# Patient Record
Sex: Female | Born: 1950 | Race: Black or African American | Hispanic: No | Marital: Married | Smoking: Never smoker
Health system: Southern US, Community
[De-identification: ages and names within clinical notes are randomized; demographics above are authoritative.]

## PROBLEM LIST (undated history)

## (undated) DIAGNOSIS — I2510 Atherosclerotic heart disease of native coronary artery without angina pectoris: Secondary | ICD-10-CM

## (undated) DIAGNOSIS — I209 Angina pectoris, unspecified: Secondary | ICD-10-CM

## (undated) DIAGNOSIS — E119 Type 2 diabetes mellitus without complications: Secondary | ICD-10-CM

## (undated) DIAGNOSIS — E785 Hyperlipidemia, unspecified: Secondary | ICD-10-CM

## (undated) DIAGNOSIS — G4700 Insomnia, unspecified: Secondary | ICD-10-CM

## (undated) DIAGNOSIS — M1990 Unspecified osteoarthritis, unspecified site: Secondary | ICD-10-CM

## (undated) DIAGNOSIS — I10 Essential (primary) hypertension: Secondary | ICD-10-CM

---

## 2003-09-28 HISTORY — PX: CORONARY ANGIOGRAM: SHX5786

## 2015-06-04 ENCOUNTER — Emergency Department (HOSPITAL_COMMUNITY): Payer: Medicaid Other

## 2015-06-04 ENCOUNTER — Emergency Department (HOSPITAL_COMMUNITY)
Admission: EM | Admit: 2015-06-04 | Discharge: 2015-06-04 | Disposition: A | Discharge status: Home / Self Care | Payer: Medicaid Other | Attending: Emergency Medicine | Admitting: Emergency Medicine

## 2015-06-04 ENCOUNTER — Emergency Department (HOSPITAL_BASED_OUTPATIENT_CLINIC_OR_DEPARTMENT_OTHER): Payer: Medicaid Other

## 2015-06-04 ENCOUNTER — Encounter (HOSPITAL_COMMUNITY): Payer: Self-pay | Admitting: *Deleted

## 2015-06-04 DIAGNOSIS — M79661 Pain in right lower leg: Secondary | ICD-10-CM | POA: Insufficient documentation

## 2015-06-04 DIAGNOSIS — M7989 Other specified soft tissue disorders: Secondary | ICD-10-CM

## 2015-06-04 DIAGNOSIS — E119 Type 2 diabetes mellitus without complications: Secondary | ICD-10-CM | POA: Diagnosis not present

## 2015-06-04 DIAGNOSIS — R51 Headache: Secondary | ICD-10-CM | POA: Insufficient documentation

## 2015-06-04 DIAGNOSIS — M542 Cervicalgia: Secondary | ICD-10-CM | POA: Insufficient documentation

## 2015-06-04 DIAGNOSIS — I10 Essential (primary) hypertension: Secondary | ICD-10-CM | POA: Insufficient documentation

## 2015-06-04 HISTORY — DX: Essential (primary) hypertension: I10

## 2015-06-04 HISTORY — DX: Hyperlipidemia, unspecified: E78.5

## 2015-06-04 HISTORY — DX: Type 2 diabetes mellitus without complications: E11.9

## 2015-06-04 LAB — CBC
HCT: 36.7 % (ref 36.0–46.0)
Hemoglobin: 12.2 g/dL (ref 12.0–15.0)
MCH: 29.2 pg (ref 26.0–34.0)
MCHC: 33.2 g/dL (ref 30.0–36.0)
MCV: 87.8 fL (ref 78.0–100.0)
PLATELETS: 211 10*3/uL (ref 150–400)
RBC: 4.18 MIL/uL (ref 3.87–5.11)
RDW: 14.6 % (ref 11.5–15.5)
WBC: 5.5 10*3/uL (ref 4.0–10.5)

## 2015-06-04 LAB — I-STAT TROPONIN, ED: TROPONIN I, POC: 0 ng/mL (ref 0.00–0.08)

## 2015-06-04 LAB — BASIC METABOLIC PANEL
ANION GAP: 7 (ref 5–15)
BUN: 10 mg/dL (ref 6–20)
CALCIUM: 9.4 mg/dL (ref 8.9–10.3)
CO2: 27 mmol/L (ref 22–32)
Chloride: 105 mmol/L (ref 101–111)
Creatinine, Ser: 0.64 mg/dL (ref 0.44–1.00)
GFR calc Af Amer: >60 mL/min (ref >60)
GLUCOSE: 96 mg/dL (ref 65–99)
Potassium: 4 mmol/L (ref 3.5–5.1)
SODIUM: 139 mmol/L (ref 135–145)

## 2015-06-04 MED ORDER — CYCLOBENZAPRINE HCL 10 MG PO TABS: 10.0000 mg | ORAL_TABLET | Freq: Three times a day (TID) | ORAL | Status: DC | PRN

## 2015-06-04 MED ORDER — CYCLOBENZAPRINE HCL 10 MG PO TABS
10.0000 mg | ORAL_TABLET | Freq: Once | ORAL | Status: AC
  Administered 2015-06-04: 10 mg via ORAL
  Filled 2015-06-04: qty 1

## 2015-06-04 MED ORDER — CYCLOBENZAPRINE HCL 10 MG PO TABS: 10.0000 mg | ORAL_TABLET | Freq: Two times a day (BID) | ORAL | Status: DC | PRN

## 2015-06-04 NOTE — Discharge Instructions (Signed)
Cervical Sprain °A cervical sprain is an injury in the neck in which the strong, fibrous tissues (ligaments) that connect your neck bones stretch or tear. Cervical sprains can range from mild to severe. Severe cervical sprains can cause the neck vertebrae to be unstable. This can lead to damage of the spinal cord and can result in serious nervous system problems. The amount of time it takes for a cervical sprain to get better depends on the cause and extent of the injury. Most cervical sprains heal in 1 to 3 weeks. °CAUSES  °Severe cervical sprains may be caused by:  °· Contact sport injuries (such as from football, rugby, wrestling, hockey, auto racing, gymnastics, diving, martial arts, or boxing).   °· Motor vehicle collisions.   °· Whiplash injuries. This is an injury from a sudden forward and backward whipping movement of the head and neck.  °· Falls.   °Mild cervical sprains may be caused by:  °· Being in an awkward position, such as while cradling a telephone between your ear and shoulder.   °· Sitting in a chair that does not offer proper support.   °· Working at a poorly designed computer station.   °· Looking up or down for long periods of time.   °SYMPTOMS  °· Pain, soreness, stiffness, or a burning sensation in the front, back, or sides of the neck. This discomfort may develop immediately after the injury or slowly, 24 hours or more after the injury.   °· Pain or tenderness directly in the middle of the back of the neck.   °· Shoulder or upper back pain.   °· Limited ability to move the neck.   °· Headache.   °· Dizziness.   °· Weakness, numbness, or tingling in the hands or arms.   °· Muscle spasms.   °· Difficulty swallowing or chewing.   °· Tenderness and swelling of the neck.   °DIAGNOSIS  °Most of the time your health care provider can diagnose a cervical sprain by taking your history and doing a physical exam. Your health care provider will ask about previous neck injuries and any known neck  problems, such as arthritis in the neck. X-rays may be taken to find out if there are any other problems, such as with the bones of the neck. Other tests, such as a CT scan or MRI, may also be needed.  °TREATMENT  °Treatment depends on the severity of the cervical sprain. Mild sprains can be treated with rest, keeping the neck in place (immobilization), and pain medicines. Severe cervical sprains are immediately immobilized. Further treatment is done to help with pain, muscle spasms, and other symptoms and may include: °· Medicines, such as pain relievers, numbing medicines, or muscle relaxants.   °· Physical therapy. This may involve stretching exercises, strengthening exercises, and posture training. Exercises and improved posture can help stabilize the neck, strengthen muscles, and help stop symptoms from returning.   °HOME CARE INSTRUCTIONS  °· Put ice on the injured area.   °¨ Put ice in a plastic bag.   °¨ Place a towel between your skin and the bag.   °¨ Leave the ice on for 15-20 minutes, 3-4 times a day.   °· If your injury was severe, you may have been given a cervical collar to wear. A cervical collar is a two-piece collar designed to keep your neck from moving while it heals. °¨ Do not remove the collar unless instructed by your health care provider. °¨ If you have long hair, keep it outside of the collar. °¨ Ask your health care provider before making any adjustments to your collar. Minor   adjustments may be required over time to improve comfort and reduce pressure on your chin or on the back of your head.  Ifyou are allowed to remove the collar for cleaning or bathing, follow your health care provider's instructions on how to do so safely.  Keep your collar clean by wiping it with mild soap and water and drying it completely. If the collar you have been given includes removable pads, remove them every 1-2 days and hand wash them with soap and water. Allow them to air dry. They should be completely  dry before you wear them in the collar.  If you are allowed to remove the collar for cleaning and bathing, wash and dry the skin of your neck. Check your skin for irritation or sores. If you see any, tell your health care provider.  Do not drive while wearing the collar.   Only take over-the-counter or prescription medicines for pain, discomfort, or fever as directed by your health care provider.   Keep all follow-up appointments as directed by your health care provider.   Keep all physical therapy appointments as directed by your health care provider.   Make any needed adjustments to your workstation to promote good posture.   Avoid positions and activities that make your symptoms worse.   Warm up and stretch before being active to help prevent problems.  SEEK MEDICAL CARE IF:   Your pain is not controlled with medicine.   You are unable to decrease your pain medicine over time as planned.   Your activity level is not improving as expected.  SEEK IMMEDIATE MEDICAL CARE IF:   You develop any bleeding.  You develop stomach upset.  You have signs of an allergic reaction to your medicine.   Your symptoms get worse.   You develop new, unexplained symptoms.   You have numbness, tingling, weakness, or paralysis in any part of your body.  MAKE SURE YOU:   Understand these instructions.  Will watch your condition.  Will get help right away if you are not doing well or get worse. Document Released: 07/11/2007 Document Revised: 09/18/2013 Document Reviewed: 03/21/2013 [facility] Patient Information 2015 [place], [place]. This information is not intended to replace advice given to you by your health care provider. Make sure you discuss any questions you have with your health care provider.   Emergency Department Resource Guide 1) Find a Doctor and Pay Out of Pocket Although you won't have to find out who is covered by your insurance plan, it is a good idea to ask  around and get recommendations. You will then need to call the office and see if the doctor you have chosen will accept you as a new patient and what types of options they offer for patients who are self-pay. Some doctors offer discounts or will set up payment plans for their patients who do not have insurance, but you will need to ask so you aren't surprised when you get to your appointment.  2) Contact Your Local Health Department Not all health departments have doctors that can see patients for sick visits, but many do, so it is worth a call to see if yours does. If you don't know where your local health department is, you can check in your phone book. The CDC also has a tool to help you locate your state's health department, and many state websites also have listings of all of their local health departments.  3) Find a [facility] If your illness is not  likely to be very severe or complicated, you may want to try a walk in clinic. These are popping up all over the country in pharmacies, drugstores, and shopping centers. They're usually staffed by nurse practitioners or physician assistants that have been trained to treat common illnesses and complaints. They're usually fairly quick and inexpensive. However, if you have serious medical issues or chronic medical problems, these are probably not your best option. ° °No Primary Care Doctor: °- Call [facility] Connect at  832-8000 - they can help you locate a primary care doctor that  accepts your insurance, provides certain services, etc. °- Physician Referral Service- 1-800-533-3463 ° °Chronic Pain Problems: °Organization         Address  Phone   Notes  °[facility]  (336) 297-2271 Patients need to be referred by their primary care doctor.  ° °Medication Assistance: °Organization         Address  Phone   Notes  °[place] [facility] [address] °[place], [place] [address] (336) 641-8030 --Must be a  resident of [place] °-- Must have NO insurance coverage whatsoever (no Medicaid/ Medicare, etc.) °-- The pt. MUST have a primary care doctor that directs their care regularly and follows them in the community °  °[facility]  (866) 331-1348   °[employer]  (888) 892-1162   ° °Agencies that provide inexpensive medical care: °Organization         Address  Phone   Notes  °[facility]  (336) 832-8035   °[facility]    (336) 832-7272   °[facility] [address] °[place], [place] [address] (336) 832-4777   °[facility] [address], °[place] (336) 271-4999   °[facility]    (336) 373-0678   °[facility]    (336) 272-1050   °[facility] ° [address], [place] Phone:  (336) 832-4444, Fax:  (336) 832-4440 Hours of Operation:  9 am - 6 pm, M-F.  Also accepts Medicaid/Medicare and self-pay.  °[facility] ° [address], [place] Phone: (336) [phone], Fax: (336) [phone]. Hours of Operation:  8:30 am - 5:30 pm, M-F.  Also accepts Medicaid and self-pay.  °[facility] [place] [address], [place] Phone: (336) 878-6027   °[facility] [address], [place],  (336)723-1848, Ext. 123 Mondays & Thursdays: 7-9 AM.  First 15 patients are seen on a first come, first serve basis. °  ° °Medicaid-accepting [place] Providers: ° °Organization         Address  Phone   Notes  °[facility] [address], [place] (336) 641-2100 Also accepts self-pay patients.  °[facility] [address], [place] ° (336) 856-9996   °[facility] [address], [place] (336) 288-8857   °[facility] [address] [place] [address], [place] (336) 299-7000   °Veita Bland [address], [place]  ° (336) 373-1557 Only accepts  [place] Access Medicaid patients after they have their name applied to their card.  ° °Self-Pay (no insurance) in [place]: ° °Organization         Address  Phone   Notes  °Sickle Cell Patients, [facility] [address],  (336) 832-1970   °[facility] [address], [place] (336) 832-4400   °[facility] ° [address], [place] (336) 992-4800   °[facility]/Dr. Osei-Bonsu ° [address] [place] [address], [place] or [address], [place] (336) [phone] Phone number for both [place] and [place] locations is the same.  °[facility] [address], [place] (336) 299-0000   °[facility] [address] [place] [address], [place] or [address] (336) 852-7530 °(336) 878-2260   °[facility] [address], [place] (336) 350-1642, phone; (336) 294-5005, fax Sees patients 1st and 3rd Saturday of every month.  Must not qualify for public or private insurance (i.e. Medicaid, Medicare, [place] Health Choice, Veterans' Benefits) • Household income should be no more than 200% of the poverty level •The clinic cannot treat you if you are pregnant or think you are pregnant • Sexually transmitted diseases are not treated at the clinic.  ° ° °Dental Care: °Organization         Address  Phone  Notes  °[facility] [address], [place] (336) 641-6152 Accepts children up to age 21 who are enrolled in Medicaid or [place] Health Choice; pregnant women with a Medicaid card; and children who have applied for Medicaid or [place] Health Choice, but were declined, whose parents can pay a reduced fee at time of service.  °[facility] [place]  [address], [place] (336) 641-7733 Accepts children up to age 21 who are enrolled in Medicaid or [place] Health Choice; pregnant women  with a Medicaid card; and children who have applied for Medicaid or [place] Health Choice, but were declined, whose parents can pay a reduced fee at time of service.  °[facility] ° [address], [place] (336) 641-4533 Patients are seen by appointment only. Walk-ins are not accepted. [facility] will see patients 18 years of age and older. °Monday - Tuesday (8am-5pm) °Most Wednesdays (8:30-5pm) °$30 per visit, cash only  °[facility] ° [address], [place] (336) 641-4533 Patients are seen by appointment only. Walk-ins are not accepted. [facility] will see patients 18 years of age and older. °One Wednesday Evening (Monthly: Volunteer Based).  $30 per visit, cash only  °[facility]  (919) 537-3737 for adults; Children under age 4, call Graduate Pediatric Dentistry at ([phone]) [phone]. Children aged 4-14, please call ([phone]) [phone] to request a pediatric application. ° Dental services are provided in all areas of dental care including fillings, crowns and bridges, complete and partial dentures, implants, gum treatment, root canals, and extractions. Preventive care is also provided. Treatment is provided to both adults and children. °Patients are selected via a lottery and there is often a waiting list. °  °[facility] [address], °[place] ° (336) 763-8833 [email or web address] °  °[facility] [address], [place], [place] (336)723-1848, Ext. 123 Second and Fourth Thursday of each month, opens at 6:30 AM; Clinic ends at 9 AM.  Patients are seen on a first-come first-served basis, and a limited number are seen during each clinic.  ° °[facility] ° [address], [place], [place] (336) 723-7904   Eligibility Requirements °You must have lived in [place], [place], or [place] counties for at least the last three months. °    You cannot be eligible for state or federal sponsored healthcare  insurance, including Veterans Administration, Medicaid, or Medicare. °  You generally cannot be eligible for healthcare insurance through your employer.  °  How to apply: °Eligibility screenings are held every Tuesday and Wednesday afternoon from 1:00 pm until 4:00 pm. You do not need an appointment for the interview!  °[facility] [address], [place], [place] 336-631-2330   °[facility]  336-342-8273   °[facility]  336-703-3100   °[facility]  336-570-6415   ° °Behavioral Health Resources in the Community: °Intensive Outpatient Programs °Organization         Address  Phone  Notes  °[facility] [address], [place], [place] 336-878-6098   °[facility] [address], [place], [place] 336-832-9800   °[facility] [address], [place], [place] ° 336-882-2125   °[facility] [address],  °[place], [place] 1-800-853-5163 or 336-641-4981   °Substance Abuse Resources °Organization         Address  Phone  Notes  °[facility]  336-882-2125   °[facility]  336-784-9470   °[facility]  336-285-9073   °[facility]  336-845-3988   °Residential & Outpatient Substance Abuse Program  1-800-659-3381   °Psychological Services °Organization         Address  Phone  Notes  °[facility]  336- 832-9600   °[facility]  336- 378-7881   °[facility] [address], [place] 1-800-853-5163 or 336-641-4981   ° °Mobile Crisis Teams °Organization         Address  Phone  Notes  °[facility], Mobile Crisis Care Unit  1-877-626-1772   °Assertive °[facility] ° [address] [place], [place] 336-834-9664   °Sharon DeEsch [address] °[place] 336-554-5454   ° °Self-Help/Support Groups °Organization         Address  Phone             Notes  °[facility] - variety of support groups  336- 373-1402 Call for more information  °Narcotics Anonymous (NA), [facility] [address], °[place]  2 meetings at this location  ° °Residential Treatment Programs °Organization         Address  Phone  Notes  °[facility] [address],    °[place]  1-866-801-8205   °[facility] ° [address], [place], [place] 704-293-8524   °[facility] [address], [place] 336-845-3988 Admissions: 8am-3pm M-F  °[facility] [address]    °[place], [place] 336-841-1104   °[facility] [address], [place], [place] 336-379-7146   °[facility] [address]  °[place], [place] 336-285-9073   °[facility] Intensive Outpatient [address], [place], [place] 336-852-3033   °[facility] [address]  °[place], [place] 1-877-615-2722 or 336-784-9470   °[facility] [address] [place],  336-227-7417 Accepts Medicaid  °[facility] [address]  °  1-800-659-3381 Substance Abuse/Addiction Treatment  ° °[place] Behavioral Health Resources °Organization         Address  Phone  Notes  °[facility]  (888) 581-9988   °Julie Brannon, PhD [address], [place]   (623)307-1655 or 407-035-3376   [facility]   [address] [place], [place] 418-196-7289   [facility] [address] [place], [place] 580-739-9665 Insurance/Medicaid/sponsorship through [facility] [address]                                    [place], [place] (412)645-0792 Therapy/tele-psych/case  [facility] [address], [place] 684-867-9742    Dr. Lolly Mustache  (515)[phone]   [facility]  [employer] [facility] Dept. 1) [address]  [place] 2) [address], [place] 3)  [address], [place] (567) [phone]  985-206-0558   [facility] Child Abuse Hotline (925)247-4062 or 251-887-9828 (After Hours)       Cervical Sprain A cervical sprain is an injury in the neck in which the strong, fibrous tissues (ligaments) that connect your neck bones stretch or tear. Cervical sprains can range from mild to severe. Severe cervical sprains can cause the neck vertebrae to be unstable. This can lead to damage of the spinal cord and can result in serious nervous system problems. The amount of time it takes for a cervical sprain to get better depends on the cause and extent of the injury. Most cervical sprains heal in 1 to 3 weeks. CAUSES  Severe cervical sprains may be caused by:   Contact sport injuries (such as from football, rugby, wrestling, hockey, auto racing, gymnastics, diving, martial arts, or boxing).   Motor vehicle collisions.   Whiplash injuries. This is an injury from a sudden forward and backward whipping movement of the head and neck.  Falls.  Mild cervical sprains may be caused by:   Being in an awkward position, such as while cradling a telephone between your ear and shoulder.   Sitting in a chair that does not offer proper support.   Working at a poorly Marketing executive station.   Looking up or down for long periods of time.  SYMPTOMS   Pain, soreness, stiffness, or a burning sensation in the front, back, or sides of the neck. This discomfort may develop immediately after the injury or slowly, 24 hours or more after the injury.   Pain or tenderness directly in the middle of the back of the neck.   Shoulder or upper back pain.   Limited ability to move the neck.   Headache.   Dizziness.   Weakness, numbness, or tingling in the hands or arms.   Muscle spasms.   Difficulty swallowing or chewing.   Tenderness and swelling of the neck.   DIAGNOSIS  Most of the time your health care provider can diagnose a cervical sprain by taking your history and doing a physical exam. Your health care provider will ask about previous neck injuries and any known neck problems, such as arthritis in the neck. X-rays may be taken to find out if there are any other problems, such as with the bones of the neck. Other tests, such as a CT scan or MRI, may also be needed.  TREATMENT  Treatment depends on the severity of the cervical sprain. Mild sprains can be treated with rest, keeping the neck in place (immobilization), and pain medicines. Severe cervical sprains are immediately immobilized. Further treatment is done  to help with pain, muscle spasms, and other symptoms and may include:  Medicines, such as pain relievers, numbing medicines, or muscle relaxants.   Physical therapy. This may involve stretching exercises, strengthening exercises, and posture training. Exercises and improved posture can help stabilize the neck, strengthen muscles, and help stop symptoms from returning.  HOME CARE INSTRUCTIONS   Put ice on the injured area.   Put ice in a plastic bag.   Place a towel between your skin and the bag.   Leave the ice on for 15-20 minutes, 3-4 times a day.   If your injury was severe, you may have been given a cervical collar to wear. A cervical collar is a two-piece collar designed to keep your neck from moving while it heals.  Do not remove the collar unless instructed by your health care provider.  If you have long hair, keep it outside of the collar.  Ask your health care provider before making any adjustments to your collar. Minor adjustments may be required over time to improve comfort and reduce pressure on your chin or on the back of your head.  Ifyou are allowed to remove the collar for cleaning or bathing, follow your health care provider's instructions on how to do so safely.  Keep your collar clean by wiping it with  mild soap and water and drying it completely. If the collar you have been given includes removable pads, remove them every 1-2 days and hand wash them with soap and water. Allow them to air dry. They should be completely dry before you wear them in the collar.  If you are allowed to remove the collar for cleaning and bathing, wash and dry the skin of your neck. Check your skin for irritation or sores. If you see any, tell your health care provider.  Do not drive while wearing the collar.   Only take over-the-counter or prescription medicines for pain, discomfort, or fever as directed by your health care provider.   Keep all follow-up appointments as directed by your health care provider.   Keep all physical therapy appointments as directed by your health care provider.   Make any needed adjustments to your workstation to promote good posture.   Avoid positions and activities that make your symptoms worse.   Warm up and stretch before being active to help prevent problems.  SEEK MEDICAL CARE IF:   Your pain is not controlled with medicine.   You are unable to decrease your pain medicine over time as planned.   Your activity level is not improving as expected.  SEEK IMMEDIATE MEDICAL CARE IF:   You develop any bleeding.  You develop stomach upset.  You have signs of an allergic reaction to your medicine.   Your symptoms get worse.   You develop new, unexplained symptoms.   You have numbness, tingling, weakness, or paralysis in any part of your body.  MAKE SURE YOU:   Understand these instructions.  Will watch your condition.  Will get help right away if you are not doing well or get worse. Document Released: 07/11/2007 Document Revised: 09/18/2013 Document Reviewed: 03/21/2013 [facility] Patient Information 2015 [place], [place]. This information is not intended to replace advice given to you by your health care provider. Make sure you discuss any questions you  have with your health care provider.   Emergency Department Resource Guide 1) Find a Doctor and Pay Out of Pocket Although you won't have to find out who is covered by your insurance  plan, it is a good idea to ask around and get recommendations. You will then need to call the office and see if the doctor you have chosen will accept you as a new patient and what types of options they offer for patients who are self-pay. Some doctors offer discounts or will set up payment plans for their patients who do not have insurance, but you will need to ask so you aren't surprised when you get to your appointment.  2) Contact Your Local Health Department Not all health departments have doctors that can see patients for sick visits, but many do, so it is worth a call to see if yours does. If you don't know where your local health department is, you can check in your phone book. The CDC also has a tool to help you locate your state's health department, and many state websites also have listings of all of their local health departments.  3) Find a Walk-in Clinic If your illness is not likely to be very severe or complicated, you may want to try a walk in clinic. These are popping up all over the country in pharmacies, drugstores, and shopping centers. They're usually staffed by nurse practitioners or physician assistants that have been trained to treat common illnesses and complaints. They're usually fairly quick and inexpensive. However, if you have serious medical issues or chronic medical problems, these are probably not your best option.  No Primary Care Doctor: - Call Health Connect at  [phone] - they can help you locate a primary care doctor that  accepts your insurance, provides certain services, etc. - Physician Referral Service- [phone]  Chronic Pain Problems: Organization         Address  Phone   Notes  [facility]  [phone] Patients need to be referred by their  primary care doctor.   Medication Assistance: Organization         Address  Phone   Notes  [facility] [address] [place], [place] [address] [phone] --Must be a resident of [facility] -- Must have NO insurance coverage whatsoever (no Medicaid/ Medicare, etc.) -- The pt. MUST have a primary care doctor that directs their care regularly and follows them in the community   [facility]  [phone]   [employer]  [phone]    Agencies that provide inexpensive medical care: Organization         Address  Phone   Notes  [facility]  [phone]   [facility]    [phone]   [facility] [address] [place], [place] [address] [phone]   [facility] [address] [place], [place] [phone]   Planned Parenthood    [phone]   [facility]    [phone]   [facility]  [address], [place] Phone:  [phone], Fax:  [phone] Hours of Operation:  9 am - 6 pm, M-F.  Also accepts Medicaid/Medicare and self-pay.  [facility]  [address], [place] Phone: [phone], Fax: [phone]. Hours of Operation:  8:30 am - 5:30 pm, M-F.  Also accepts Medicaid and self-pay.  [facility] [place] [address] [place] Phone: [phone]   [facility] [address] Natasha Bence [place], [place] [phone], Ext. [phone] Mondays & Thursdays: 7-9  AM.  First 15 patients are seen on a first come, first serve basis.    Medicaid-accepting [facility] Providers:  Organization         Address  Phone   Notes  [facility] [address], [place] [phone] Also accepts self-pay patients.  [facility] [address] Laurell Josephs [place], [place]  [phone]   [facility] [address], [place] [phone]   [facility] [address], [place] [phone]   Renaye Rakers [address], [place]   [phone] Only accepts [place] Access [place] patients after they have their name applied to their card.   Self-Pay (no insurance) in [facility]:  Organization         Address  Phone   Notes  Sickle Cell Patients, [facility] [address] [place], [place] [phone]   [facility] [address] [place], [place] [phone]   [facility] [place]  [address], [place] [phone]   [facility]/Dr. Osei-Bonsu  [address], [place] or [address], [place] [phone] Phone number for both [place] and [place] locations is the same.  [facility]gent Medical and [facility] [address], [employer] [phone]   [facility] [address], [place] or [address] [phone]   [facility] [address] [place] [phone], phone; [phone], fax Sees patients 1st and 3rd Saturday of every month.  Must not qualify for public or private insurance (i.e. Medicaid, Medicare, [place] Health Choice, Veterans' Benefits)  Household income should be no more than 200% of the poverty level The clinic cannot treat you if you are pregnant or think you are pregnant  Sexually transmitted diseases are not treated at the clinic.    Dental Care: Organization         Address  Phone  Notes  [facility] Department of [facility] [address] [place], [place] [phone] Accepts children up to age 43 who are enrolled in [place] or [place] Health Choice; pregnant women with a Medicaid card; and children who have applied for Medicaid or [place] Health Choice, but were declined, whose parents can pay a reduced fee  at time of service.  [facility] Department of [facility]  [address], [place] [phone] Accepts children up to age 5 who are enrolled in [place] or [place] Health Choice; pregnant women with a Medicaid card; and children who have applied for Medicaid or [place] Health Choice, but were declined, whose parents can pay a reduced fee at time of service.  [facility]  [address] [place], [place] [phone] Patients are seen by appointment only. Walk-ins are not accepted. [facility] will see patients 33 years of age and older. Monday - Tuesday (8am-5pm) Most Wednesdays (8:30-5pm) $30 per visit, cash only  [facility] Adult Dental Access PROGRAM  [address], [facility] [phone] Patients are seen by appointment only. Walk-ins are not accepted. [facility] will see patients 29 years of age and older. One Wednesday Evening (Monthly: Volunteer Based).  $30 per visit, cash only  [employer]  [phone] for adults; Children under age 73,  call Graduate Pediatric Dentistry at [phone]. Children aged 2-14, please call [phone] to request a pediatric application.  Dental services are provided in all areas of dental care including fillings, crowns and bridges, complete and partial dentures, implants, gum treatment, root canals, and extractions. Preventive care is also provided. Treatment is provided to both adults and children. Patients are selected via a lottery and there is often a waiting list.   [facility] [address], [place]  [phone] [email or web address]   [facility] [address] [place], [place] [phone], Ext. [phone] Second and Fourth Thursday of each month, opens at 6:30 AM; Clinic ends at 9 AM.  Patients are seen on a first-come first-served basis, and a limited number are seen during each clinic.   [facility]  [address] Ether Griffins [place], [place] [phone]   Eligibility Requirements You must have lived in [place], [place], or [place] counties for at least the last three months.   You cannot be eligible for state or federal sponsored [place], including [employer], [place], or [employer].   You generally cannot be eligible for healthcare insurance through your employer.    How to apply: Eligibility screenings are held every Tuesday and Wednesday afternoon from 1:00 pm until 4:00 pm. You do not need an appointment for the interview!  [facility] [address] [place], [place] [phone]   [facility] Health Department  [phone]   [facility]  [phone]   [facility] Health Department  [phone]    Behavioral Health Resources in the Community: Intensive Outpatient Programs Organization         Address  Phone  Notes  [facility] [address], [place], [place] [phone]   [facility] Outpatient [address] [place], [place] [phone]   [facility] [address] [place], [place]  [phone]   [facility] Mental Health [address]  [place], [place] [phone] or [phone]   Substance Abuse Resources Organization         Address  Phone  Notes  [facility]  [phone]   [facility]  [phone]   [facility] [place]  7077976915   Floydene Flock  [phone]   Residential & Outpatient Substance Abuse Program  [phone]   Psychological Services Organization         Address  Phone  Notes  [facility] Behavior[facility] Health  [phone]   [facility]  [phone]   [facility] Mental Health [address] [place] [phone] or [phone]    Mobile Crisis Teams Organization         Address  Phone  Notes  [facility], Mobile Crisis Care Unit  [phone]   [facility]  Services  [address] [place], [place] [phone]   Doristine Locks [address] [place] [phone]    Self-Help/Support Groups Organization         Address  Phone             Notes  [facility] [place] - variety of support groups  336- I7437963 Call for more information  Narcotics Anonymous (NA), [facility] [address], [employer] [place]  2 meetings at this location   Statistician         Address  Phone  Notes  [facility] [address],  [place]  [phone]   [facility]  [address] [place] [address] [place] [address], [place], [place] [phone]   [facility] [address] [place], [place] [phone] Admissions: 8am-3pm M-F  [facility] [address],    [place], [place] [phone]   [facility] [address] [place], [place], [place] [phone]   [facility] [address],  [place], [place] [phone]   [facility] - Intensive Outpatient [address] Laurell Joseph[address], [place], [place] [phone]   [facility] (Addiction Recovery Care [facility]oc[facility] [address] [place]  [place], [place] [phone] or [phone]   [facility] ([facility]) [address] [place] [address] [place], [place] [phone] Accepts Medicaid  [facility] [place] [address],  [place] [phone] Substance Abuse/Addiction Treatment   [facility] Organization         Address  Phone  Notes  [facility]  [phone]   Angie Fava, PhD [address] Ervin Knack [place], [place]   [phone] or [phone]   [facility] Behavioral   [address] [place], [place] [phone]   [facility] [address], [place], [place] [phone] Insurance/Medicaid/sponsorship through [facility] Families [address]                                    [place], [place] [phone] Therapy/tele-psych/case  [facility] [address], [place] [phone]    Dr. Lolly Mustache  [phone]   [facility]  [employer] [facility] Dept. 1) [address], [place] 2) [address], [place] 3)  [address], [place] [phone]  [phone]   [facility] Child Abuse Hotline [phone])[phone] or [phone] (After Hours)

## 2015-06-04 NOTE — ED Notes (Signed)
Patient transported to Ultrasound 

## 2015-06-04 NOTE — Progress Notes (Signed)
VASCULAR LAB PRELIMINARY  PRELIMINARY  PRELIMINARY  PRELIMINARY  Right lower extremity venous duplex completed.    Preliminary report:  Right:  No evidence of DVT, superficial thrombosis, or Baker's cyst.  Jaret Coppedge, RVT 06/04/2015, 2:44 PM

## 2015-06-04 NOTE — ED Notes (Signed)
Pt reports left neck pain that started last night. Pt states that she was unable to sleep due to pain. Pt also reports a headache. Pt states that it hurts worse with movement.

## 2015-06-04 NOTE — ED Provider Notes (Signed)
CSN: [id number]     Arrival date & time 06/04/15  1156 History   First MD Initiated Contact with Patient 06/04/15 1316     Chief Complaint  Patient presents with  . Neck Injury     (Consider location/radiation/quality/duration/timing/severity/associated sxs/prior Treatment) HPI Comments: Here with multiple complaints. Neck pain: Began yesterday, no trauma. Was walking in [place] when it began. Worse with turning head. Located in the left side of the neck. No blurry vision.  Headache: History of chronic headaches. Has had cramp-like headaches that are intermittent for the past several days. No fevers, blurry vision, nausea, vomiting.  Calf pain: Atraumatic right-sided calf pain for the past 2 days. Feels very painful in the back and worse with walking and palpation.  Patient is a 64 y.o. female presenting with neck injury. The history is provided by the patient.  Neck Injury This is a new problem. The current episode started yesterday. The problem occurs constantly. The problem has not changed since onset.Associated symptoms include headaches. Pertinent negatives include no chest pain, no abdominal pain and no shortness of breath. Exacerbated by: turning the head. Nothing relieves the symptoms.    Past Medical History  Diagnosis Date  . Hypertension   . Diabetes mellitus without complication   . Hyperlipemia    History reviewed. No pertinent past surgical history. No family history on file. Social History  Substance Use Topics  . Smoking status: Never Smoker   . Smokeless tobacco: None  . Alcohol Use: None   OB History    No data available     Review of Systems  Constitutional: Negative for fever and chills.  Eyes: Negative for redness and visual disturbance.  Respiratory: Negative for cough and shortness of breath.   Cardiovascular: Negative for chest pain.  Gastrointestinal: Negative for nausea, vomiting and abdominal pain.  Neurological: Positive for headaches. Negative  for dizziness and syncope.  All other systems reviewed and are negative.     Allergies  Review of patient's allergies indicates no known allergies.  Home Medications   Prior to Admission medications   Not on File   BP 144/82 mmHg  Pulse 66  Temp(Src) 98 F (36.7 C) (Oral)  Resp 16  SpO2 100% Physical Exam  Constitutional: She is oriented to person, place, and time. She appears well-developed and well-nourished. No distress.  HENT:  Head: Normocephalic and atraumatic.  Mouth/Throat: Oropharynx is clear and moist.  Eyes: EOM are normal. Pupils are equal, round, and reactive to light.  Neck: Normal range of motion. Neck supple.  Cardiovascular: Normal rate and regular rhythm.  Exam reveals no friction rub.   No murmur heard. Pulmonary/Chest: Effort normal and breath sounds normal. No respiratory distress. She has no wheezes. She has no rales.  Abdominal: Soft. She exhibits no distension. There is no tenderness. There is no rebound.  Musculoskeletal: Normal range of motion. She exhibits no edema.       Cervical back: She exhibits tenderness (L lateral neck, anterior to the trapezius).       Right lower leg: She exhibits tenderness (in calf muscles). She exhibits no swelling.  Neurological: She is alert and oriented to person, place, and time.  Skin: She is not diaphoretic.  Nursing note and vitals reviewed.   ED Course  Procedures (including critical care time) Labs Review Labs Reviewed  CBC  BASIC METABOLIC PANEL  I-STAT TROPOININ, ED    Imaging Review Ct Head Wo Contrast  06/04/2015   CLINICAL DATA:  Chronic headaches  for years.  EXAM: CT HEAD WITHOUT CONTRAST  TECHNIQUE: Contiguous axial images were obtained from the base of the skull through the vertex without intravenous contrast.  COMPARISON:  None.  FINDINGS: Bony calvarium appears intact. Mild diffuse cortical atrophy is noted. Minimal chronic ischemic white matter disease is noted. No mass effect or midline shift  is noted. Ventricular size is within normal limits. There is no evidence of mass lesion, hemorrhage or acute infarction.  IMPRESSION: Mild diffuse cortical atrophy. Minimal chronic ischemic white matter disease. No acute intracranial abnormality seen.   Electronically Signed   By: Lupita Raider, M.D.   On: 06/04/2015 15:57   I have personally reviewed and evaluated these images and lab results as part of my medical decision-making.   EKG Interpretation   Date/Time:  Wednesday June 04 2015 12:09:48 EDT Ventricular Rate:  77 PR Interval:  180 QRS Duration: 72 QT Interval:  366 QTC Calculation: 414 R Axis:   70 Text Interpretation:  Normal sinus rhythm Normal ECG No prior for  comparison Confirmed by Gwendolyn Grant  MD, Cari Burgo ([id number]) on 06/04/2015 4:44:26 PM     Filed: 06/04/2015 2:44 PM Note Time: 06/04/2015 2:44 PM Status: Signed    Editor: Gara Kroner, RVT (Cardiovascular Sonographer)     Expand All Collapse All   VASCULAR LAB PRELIMINARY PRELIMINARY PRELIMINARY PRELIMINARY  Right lower extremity venous duplex completed.   Preliminary report: Right: No evidence of DVT, superficial thrombosis, or Baker's cyst.  Cestone,Helene, RVT 06/04/2015, 2:44 PM      MDM   Final diagnoses:  Neck pain    64 year old female here with multiple complaints. Neck pain: Seems musculoskeletal. Mildly tender to palpation, worse with movement. No blurry vision. No trauma. Will give muscle relaxers.  Headaches: Feels like full head cramps. No fevers. No neurologic signs. Intermittent over the past 5-6 days. History of chronic headaches and states this feels nothing like her prior. We'll scan her head.  CT negative  Calf pain: Right-sided, no trauma, worse with walking. No swelling. Will ultrasound - negative  She also had some vague CP. EKG and troponin are ok.   Elwin Mocha, MD 06/04/15 [phone]

## 2015-07-30 ENCOUNTER — Emergency Department (HOSPITAL_COMMUNITY): Payer: Medicaid Other

## 2015-07-30 ENCOUNTER — Observation Stay (HOSPITAL_COMMUNITY)
Admission: EM | Admit: 2015-07-30 | Discharge: 2015-07-31 | Disposition: A | Discharge status: Home / Self Care | Payer: Medicaid Other | Attending: Internal Medicine | Admitting: Internal Medicine

## 2015-07-30 ENCOUNTER — Encounter (HOSPITAL_COMMUNITY): Payer: Self-pay

## 2015-07-30 ENCOUNTER — Other Ambulatory Visit (HOSPITAL_COMMUNITY): Payer: Medicaid Other

## 2015-07-30 ENCOUNTER — Observation Stay (HOSPITAL_COMMUNITY): Payer: Medicaid Other

## 2015-07-30 DIAGNOSIS — R002 Palpitations: Secondary | ICD-10-CM | POA: Diagnosis not present

## 2015-07-30 DIAGNOSIS — I208 Other forms of angina pectoris: Secondary | ICD-10-CM | POA: Diagnosis not present

## 2015-07-30 DIAGNOSIS — E118 Type 2 diabetes mellitus with unspecified complications: Secondary | ICD-10-CM | POA: Diagnosis not present

## 2015-07-30 DIAGNOSIS — E785 Hyperlipidemia, unspecified: Secondary | ICD-10-CM | POA: Diagnosis not present

## 2015-07-30 DIAGNOSIS — I2089 Other forms of angina pectoris: Secondary | ICD-10-CM | POA: Diagnosis present

## 2015-07-30 DIAGNOSIS — R079 Chest pain, unspecified: Secondary | ICD-10-CM | POA: Diagnosis present

## 2015-07-30 DIAGNOSIS — Z7982 Long term (current) use of aspirin: Secondary | ICD-10-CM | POA: Diagnosis not present

## 2015-07-30 DIAGNOSIS — Z79899 Other long term (current) drug therapy: Secondary | ICD-10-CM | POA: Diagnosis not present

## 2015-07-30 DIAGNOSIS — E119 Type 2 diabetes mellitus without complications: Secondary | ICD-10-CM | POA: Diagnosis present

## 2015-07-30 DIAGNOSIS — I10 Essential (primary) hypertension: Secondary | ICD-10-CM | POA: Diagnosis present

## 2015-07-30 DIAGNOSIS — E1165 Type 2 diabetes mellitus with hyperglycemia: Secondary | ICD-10-CM | POA: Diagnosis present

## 2015-07-30 DIAGNOSIS — IMO0002 Reserved for concepts with insufficient information to code with codable children: Secondary | ICD-10-CM | POA: Diagnosis present

## 2015-07-30 DIAGNOSIS — R1310 Dysphagia, unspecified: Secondary | ICD-10-CM

## 2015-07-30 DIAGNOSIS — Z23 Encounter for immunization: Secondary | ICD-10-CM | POA: Diagnosis not present

## 2015-07-30 HISTORY — DX: Insomnia, unspecified: G47.00

## 2015-07-30 HISTORY — DX: Unspecified osteoarthritis, unspecified site: M19.90

## 2015-07-30 LAB — COMPREHENSIVE METABOLIC PANEL
ALBUMIN: 3.9 g/dL (ref 3.5–5.0)
ALT: 19 U/L (ref 14–54)
AST: 22 U/L (ref 15–41)
Alkaline Phosphatase: 59 U/L (ref 38–126)
Anion gap: 9 (ref 5–15)
BILIRUBIN TOTAL: 0.4 mg/dL (ref 0.3–1.2)
BUN: 11 mg/dL (ref 6–20)
CHLORIDE: 101 mmol/L (ref 101–111)
CO2: 26 mmol/L (ref 22–32)
CREATININE: 0.7 mg/dL (ref 0.44–1.00)
Calcium: 9.8 mg/dL (ref 8.9–10.3)
GFR calc Af Amer: >60 mL/min (ref >60)
GLUCOSE: 287 mg/dL — AB (ref 65–99)
POTASSIUM: 3.9 mmol/L (ref 3.5–5.1)
Sodium: 136 mmol/L (ref 135–145)
TOTAL PROTEIN: 6.9 g/dL (ref 6.5–8.1)

## 2015-07-30 LAB — CBC
HEMATOCRIT: 38.4 % (ref 36.0–46.0)
Hemoglobin: 13.1 g/dL (ref 12.0–15.0)
MCH: 29.8 pg (ref 26.0–34.0)
MCHC: 34.1 g/dL (ref 30.0–36.0)
MCV: 87.3 fL (ref 78.0–100.0)
Platelets: 220 10*3/uL (ref 150–400)
RBC: 4.4 MIL/uL (ref 3.87–5.11)
RDW: 13.5 % (ref 11.5–15.5)
WBC: 5.2 10*3/uL (ref 4.0–10.5)

## 2015-07-30 LAB — GLUCOSE, CAPILLARY
GLUCOSE-CAPILLARY: 251 mg/dL — AB (ref 65–99)
Glucose-Capillary: 94 mg/dL (ref 65–99)
Glucose-Capillary: 117 mg/dL — ABNORMAL HIGH (ref 65–99)

## 2015-07-30 LAB — PROTIME-INR
INR: 0.98 (ref 0.00–1.49)
PROTHROMBIN TIME: 13.2 s (ref 11.6–15.2)

## 2015-07-30 LAB — BRAIN NATRIURETIC PEPTIDE: B Natriuretic Peptide: 17.5 pg/mL (ref 0.0–100.0)

## 2015-07-30 LAB — APTT: APTT: 28 s (ref 24–37)

## 2015-07-30 LAB — I-STAT TROPONIN, ED: Troponin i, poc: 0 ng/mL (ref 0.00–0.08)

## 2015-07-30 LAB — TROPONIN I
Troponin I: <0.03 ng/mL (ref <0.031)
Troponin I: <0.03 ng/mL (ref <0.031)

## 2015-07-30 LAB — TSH: TSH: 2.133 u[IU]/mL (ref 0.350–4.500)

## 2015-07-30 MED ORDER — TROLAMINE SALICYLATE 10 % EX CREA: TOPICAL_CREAM | Freq: Two times a day (BID) | CUTANEOUS | Status: DC | PRN

## 2015-07-30 MED ORDER — NITROGLYCERIN 0.4 MG SL SUBL: 0.4000 mg | SUBLINGUAL_TABLET | SUBLINGUAL | Status: DC | PRN

## 2015-07-30 MED ORDER — ATENOLOL 25 MG PO TABS
50.0000 mg | ORAL_TABLET | Freq: Every day | ORAL | Status: DC
  Administered 2015-07-31: 50 mg via ORAL
  Filled 2015-07-30: qty 2

## 2015-07-30 MED ORDER — GI COCKTAIL ~~LOC~~: 30.0000 mL | Freq: Four times a day (QID) | ORAL | Status: DC | PRN

## 2015-07-30 MED ORDER — ASPIRIN EC 81 MG PO TBEC
81.0000 mg | DELAYED_RELEASE_TABLET | Freq: Every day | ORAL | Status: DC
Start: 2015-07-30 — End: 2015-07-31
  Administered 2015-07-31: 81 mg via ORAL
  Filled 2015-07-30: qty 1

## 2015-07-30 MED ORDER — MORPHINE SULFATE (PF) 2 MG/ML IV SOLN: 2.0000 mg | INTRAVENOUS | Status: DC | PRN

## 2015-07-30 MED ORDER — SODIUM CHLORIDE 0.9 % IV SOLN
1000.0000 mL | INTRAVENOUS | Status: DC
Start: 2015-07-30 — End: 2015-07-31
  Administered 2015-07-30: 1000 mL via INTRAVENOUS

## 2015-07-30 MED ORDER — PNEUMOCOCCAL VAC POLYVALENT 25 MCG/0.5ML IJ INJ
0.5000 mL | INJECTION | INTRAMUSCULAR | Status: AC
  Administered 2015-07-31: 0.5 mL via INTRAMUSCULAR
  Filled 2015-07-30: qty 0.5

## 2015-07-30 MED ORDER — ASPIRIN 81 MG PO CHEW
324.0000 mg | CHEWABLE_TABLET | Freq: Once | ORAL | Status: AC
  Administered 2015-07-30: 324 mg via ORAL
  Filled 2015-07-30: qty 4

## 2015-07-30 MED ORDER — INSULIN ASPART 100 UNIT/ML ~~LOC~~ SOLN
0.0000 [IU] | Freq: Three times a day (TID) | SUBCUTANEOUS | Status: DC
  Administered 2015-07-31: 7 [IU] via SUBCUTANEOUS

## 2015-07-30 MED ORDER — MUSCLE RUB 10-15 % EX CREA
TOPICAL_CREAM | Freq: Two times a day (BID) | CUTANEOUS | Status: DC | PRN
  Filled 2015-07-30 (×2): qty 85

## 2015-07-30 MED ORDER — ENOXAPARIN SODIUM 40 MG/0.4ML ~~LOC~~ SOLN
40.0000 mg | SUBCUTANEOUS | Status: DC
Start: 2015-07-30 — End: 2015-07-31
  Administered 2015-07-30: 40 mg via SUBCUTANEOUS
  Filled 2015-07-30 (×2): qty 0.4

## 2015-07-30 MED ORDER — INSULIN ASPART 100 UNIT/ML ~~LOC~~ SOLN
3.0000 [IU] | Freq: Three times a day (TID) | SUBCUTANEOUS | Status: DC
  Administered 2015-07-31: 3 [IU] via SUBCUTANEOUS

## 2015-07-30 MED ORDER — ONDANSETRON HCL 4 MG/2ML IJ SOLN: 4.0000 mg | Freq: Four times a day (QID) | INTRAMUSCULAR | Status: DC | PRN

## 2015-07-30 MED ORDER — SIMVASTATIN 40 MG PO TABS
40.0000 mg | ORAL_TABLET | Freq: Every day | ORAL | Status: DC
  Administered 2015-07-30: 40 mg via ORAL
  Filled 2015-07-30: qty 1

## 2015-07-30 MED ORDER — INFLUENZA VAC SPLIT QUAD 0.5 ML IM SUSY
0.5000 mL | PREFILLED_SYRINGE | INTRAMUSCULAR | Status: AC
  Administered 2015-07-31: 0.5 mL via INTRAMUSCULAR

## 2015-07-30 MED ORDER — NIFEDIPINE 10 MG PO CAPS
20.0000 mg | ORAL_CAPSULE | Freq: Two times a day (BID) | ORAL | Status: DC
  Administered 2015-07-30 – 2015-07-31 (×2): 20 mg via ORAL
  Filled 2015-07-30 (×3): qty 2

## 2015-07-30 MED ORDER — ENALAPRIL MALEATE 5 MG PO TABS
10.0000 mg | ORAL_TABLET | Freq: Two times a day (BID) | ORAL | Status: DC
  Administered 2015-07-30 – 2015-07-31 (×2): 10 mg via ORAL
  Filled 2015-07-30 (×2): qty 2

## 2015-07-30 MED ORDER — IOHEXOL 300 MG/ML  SOLN
150.0000 mL | Freq: Once | INTRAMUSCULAR | Status: DC | PRN
  Administered 2015-07-30: 150 mL via ORAL
  Filled 2015-07-30: qty 150

## 2015-07-30 MED ORDER — ALPRAZOLAM 0.25 MG PO TABS: 0.2500 mg | ORAL_TABLET | Freq: Two times a day (BID) | ORAL | Status: DC | PRN

## 2015-07-30 MED ORDER — ACETAMINOPHEN 325 MG PO TABS: 650.0000 mg | ORAL_TABLET | ORAL | Status: DC | PRN

## 2015-07-30 NOTE — Consult Note (Signed)
Patient ID: Kim Gibson MRN: [id number], DOB/AGE: [birth year]   Admit date: 07/30/2015   Primary Physician: No primary care provider on file. Primary Cardiologist: New (Dr. Mayford Knife)  Pt. Profile:  64 y/o female with no prior cardiac history but multiple risk factors including HTN, HLD and DM, presenting to the ED with a complaint of chest pain and palpitations.   Problem List  Past Medical History  Diagnosis Date  . Hypertension   . Diabetes mellitus without complication (HCC)   . Hyperlipemia     Past Surgical History  Procedure Laterality Date  . Cesarean section       Allergies  No Known Allergies  HPI  64 y/o female with no prior cardiac history but multiple risk factors including HTN, HLD and DM, presenting to the ED with a complaint of chest pain and palpitations. She notes a family h/o CAD. Her father suffered a myocardial infarction at age 64. Her mother had a myocardial infarction at age 60. She reports that she had a heart catheterization performed in her home country of [place] 11 years ago, as part of the chest pain evaluation, however she reports that this was a normal study. She has a personal history of treated hypertension, hyperlipidemia and diabetes. She denies any tobacco use. No other significant past medical history.  She reports that she was in her normal state of health until 5 days ago when she developed sudden onset of tachy palpitations at rest. She has had multiple episode since that time. However she denies any syncope/near-syncope. She notes mild dyspnea. This has never happened to her before. She denies any recent excessive caffeine or alcohol intake. No over-the-counter cold or flu medications.  Last night, she developed resting substernal to left-sided chest discomfort. Felt pressure-like but also with some chart features. She denies any exacerbating factors. Not worse with deep breathing and not exertional. She has not tried any therapies at  home. Her chest discomfort has occurred off and on over the last 24 hours. Each episode lasts 30 minutes to one hour. Given her symptoms, she presented to the [facility] for evaluation.  Initial troponin is negative. EKG shows NSR w/o ischemia. HR 85 bmp.  BNP is normal at 17.5. K is normal at 3.9. TSH pending. No anemia. Hgb is 13.1. Normal renal function with Scr at 0.70. CXR unremarkable.    Home Medications  Prior to Admission medications   Medication Sig Start Date End Date Taking? Authorizing Provider  aspirin EC 81 MG tablet Take 81 mg by mouth daily.   Yes Historical Provider, MD  atenolol (TENORMIN) 50 MG tablet Take 50 mg by mouth daily.   Yes Historical Provider, MD  enalapril (VASOTEC) 10 MG tablet Take 10 mg by mouth 2 (two) times daily.   Yes Historical Provider, MD  glipiZIDE (GLUCOTROL) 10 MG tablet Take 10 mg by mouth 2 (two) times daily before a meal.   Yes Historical Provider, MD  metFORMIN (GLUCOPHAGE) 500 MG tablet Take 1,000 mg by mouth 2 (two) times daily with a meal.   Yes Historical Provider, MD  NIFEdipine (PROCARDIA) 20 MG capsule Take 20 mg by mouth 2 (two) times daily.   Yes Historical Provider, MD  simvastatin (ZOCOR) 40 MG tablet Take 40 mg by mouth daily at 6 PM.   Yes Historical Provider, MD    Family History  Family History  Problem Relation Age of Onset  . Heart attack Mother 18  . Heart attack Father 6  .  Heart disease Brother     Social History  Social History   Social History  . Marital Status: Married    Spouse Name: N/A  . Number of Children: N/A  . Years of Education: N/A   Occupational History  . Not on file.   Social History Main Topics  . Smoking status: Never Smoker   . Smokeless tobacco: Not on file  . Alcohol Use: No  . Drug Use: No  . Sexual Activity: Not on file   Other Topics Concern  . Not on file   Social History Narrative     Review of Systems General:  No chills, fever, night sweats or weight changes.    Cardiovascular:  No chest pain, dyspnea on exertion, edema, orthopnea, palpitations, paroxysmal nocturnal dyspnea. Dermatological: No rash, lesions/masses Respiratory: No cough, dyspnea Urologic: No hematuria, dysuria Abdominal:   No nausea, vomiting, diarrhea, bright red blood per rectum, melena, or hematemesis Neurologic:  No visual changes, wkns, changes in mental status. All other systems reviewed and are otherwise negative except as noted above.  Physical Exam  Blood pressure 94/60, pulse 86, temperature 98.3 F (36.8 C), temperature source Oral, resp. rate 18, height  (1.575 m), weight 164 lb (74.39 kg), SpO2 100 %.  General: Pleasant, NAD Psych: Normal affect. Neuro: Alert and oriented X 3. Moves all extremities spontaneously. HEENT: Normal  Neck: Supple without bruits or JVD. Lungs:  Resp regular and unlabored, CTA. Heart: RRR no s3, s4, or murmurs. Abdomen: Soft, non-tender, non-distended, BS + x 4.  Extremities: No clubbing, cyanosis or edema. DP/PT/Radials 2+ and equal bilaterally.  Labs  Troponin [facility] Test)  Recent Labs  07/30/15 1009  TROPIPOC 0.00    Recent Labs  07/30/15 1258  TROPONINI <0.03   Lab Results  Component Value Date   WBC 5.2 07/30/2015   HGB 13.1 07/30/2015   HCT 38.4 07/30/2015   MCV 87.3 07/30/2015   PLT 220 07/30/2015    Recent Labs Lab 07/30/15 1000  NA 136  K 3.9  CL 101  CO2 26  BUN 11  CREATININE 0.70  CALCIUM 9.8  PROT 6.9  BILITOT 0.4  ALKPHOS 59  ALT 19  AST 22  GLUCOSE 287*   No results found for: CHOL, HDL, LDLCALC, TRIG No results found for: DDIMER   Radiology/Studies  Dg Chest Portable 1 View  07/30/2015  CLINICAL DATA:  Shortness of breath. EXAM: PORTABLE CHEST 1 VIEW COMPARISON:  CT 06/04/2015. FINDINGS: Mediastinum and hilar structures normal. Mild right base subsegmental atelectasis. Heart size normal. No pleural effusion or pneumothorax. No acute bony abnormality. IMPRESSION: No acute  cardiopulmonary disease. Electronically Signed   By: Maisie Fus  Register   On: 07/30/2015 10:40    ECG  NSR, 85 bpm. No ischemia.   Echocardiogram - pending.   ASSESSMENT AND PLAN  Principal Problem:   Heart palpitations Active Problems:   Angina at rest [facility])   Dysphagia   Diabetes type 2, uncontrolled (HCC)   HTN (hypertension)   HLD (hyperlipidemia)    1. Heart Palpitations: EKG and telemetry shows normal sinus rhythm. K is WNL at 3.9. TSH and 2D echo pending. Monitor on telemetry.   2. Chest Pain: she has mixed typical and atypical features. Her EKG is nonischemic and initial troponin is negative. Given her risk factors, we will continue to cycle cardiac enzymes. If negative, will plan for nuclear stress test in the a.m. Continue aspirin, beta blocker, ACE inhibitor and statin.  3.  HTN: currently well controlled on home meds: atenolol, enalapril and nifedipine.   4. HLD: on simvastatin as an outpatient. Recommend fasting lipid panel to assess levels.   5: DM: Hgb A1c ordered. Primary team to follow.    Signed, Robbie LisSIMMONS, BRITTAINY, PA-C 07/30/2015, 2:35 PM

## 2015-07-30 NOTE — ED Notes (Signed)
Pt. oob to the bathroom, gait steady.  

## 2015-07-30 NOTE — H&P (Signed)
[facility] History and Physical  Kim Kennedylizabeth Freestone NWG:[id number] DOB: 06/05/1951 DOA: 07/30/2015  Referring physician: Dr. Lynelle DoctorKnapp PCP: No primary care provider on file.   Chief Complaint:  Chest pains and palpitations  HPI: Kim Gibson is a 64 y.o. female with diabetes, hypertension and hyperlipidemia presenting to the ED for evaluation of chest pain and palpitations. The patient says that she first noticed her heart racing and pounding on Friday. At bedtime last night she felt some burning chest pain that radiated down her left arm. The symptoms are intermittent and are associated with some mild shortness of breath. The patient denies any nausea, vomiting, reflux, trauma or abnormal diaphoresis. She is a never smoker. She has an extensive family history of heart disease and MI.     Of note, patient was evaluated in the ER for neck pain in September 2016.  She also has a complaint of chronic dysphagia to solids.  In the ER EKG and troponins were negative.   Review of Systems:  Constitutional:No weight loss, Fevers, chills, fatigue.  ++ chronic night sweats HEENT: Positive for dysphagia to solids, No headaches, Sore throat, No sneezing, itching, ear ache, nasal congestion,   Cardio-vascular: Positive for palpitations and chest pain, Orthopnea, PND, swelling in lower extremities, anasarca, dizziness GI: No heartburn, indigestion, abdominal pain, nausea, vomiting, diarrhea, change in bowel habits, loss of appetite  Resp: Mild shortness of breath associated with pain at exertion and rest. No excess mucus, no productive cough, No non-productive cough, No coughing up of blood.No change in color of mucus.No wheezing.No chest wall deformity  Skin: no rash or lesions.  GU: no dysuria, change in color of urine, no urgency or frequency. No flank pain.  Musculoskeletal: No joint pain or swelling. No decreased range of motion. No back pain.  Psych: No change in mood or affect. No depression  or anxiety. No memory loss.   Past Medical History  Diagnosis Date  . Hypertension   . Diabetes mellitus without complication (HCC)   . Hyperlipemia    History reviewed. No pertinent past surgical history. Social History:  reports that she has never smoked. She does not have any smokeless tobacco history on file. She reports that she does not drink alcohol. Her drug history is not on file.  Lives at home with her husband and grandson.  Independent with ADLs.  No Known Allergies  No family history on file. Mother deceased MI, aneurysm 2058. Father deceased stroke, MI 5763. Nephew MI at 49. Siblings, all hypertension and heart disease.  Prior to Admission medications   Medication Sig Start Date End Date Taking? Authorizing Provider  aspirin EC 81 MG tablet Take 81 mg by mouth daily.   Yes Historical Provider, MD  atenolol (TENORMIN) 50 MG tablet Take 50 mg by mouth daily.   Yes Historical Provider, MD  enalapril (VASOTEC) 10 MG tablet Take 10 mg by mouth 2 (two) times daily.   Yes Historical Provider, MD  glipiZIDE (GLUCOTROL) 10 MG tablet Take 10 mg by mouth 2 (two) times daily before a meal.   Yes Historical Provider, MD  metFORMIN (GLUCOPHAGE) 500 MG tablet Take 1,000 mg by mouth 2 (two) times daily with a meal.   Yes Historical Provider, MD  NIFEdipine (PROCARDIA) 20 MG capsule Take 20 mg by mouth 2 (two) times daily.   Yes Historical Provider, MD  simvastatin (ZOCOR) 40 MG tablet Take 40 mg by mouth daily at 6 PM.   Yes Historical Provider, MD   Physical Exam:  Filed Vitals:   07/30/15 1130 07/30/15 1132 07/30/15 1203 07/30/15 1230  BP: 121/69 121/69 106/63 109/63  Pulse: 77 76 89 90  Temp:      TempSrc:      Resp: Height:      Weight:      SpO2: 100% 100% 100% 99%    Wt Readings from Last 3 Encounters:  07/30/15 74.39 kg (164 lb)    General:  Appears calm and comfortable, NAD. Eyes: PERRL, normal lids, irises & conjunctiva ENT: grossly normal hearing, lips &  tongue Neck: no LAD, masses or thyromegaly Cardiovascular: RRR, no m/r/g. No LE edema. Respiratory: CTA bilaterally, no w/r/r. Normal respiratory effort. Abdomen: soft, ntnd, +bs, no masses Skin: no rash or induration seen on limited exam Musculoskeletal: grossly normal tone BUE/BLE Psychiatric: grossly normal mood and affect, speech fluent and appropriate Neurologic: grossly non-focal.  Follows commands, CN 2-12 grossly in tact.          Labs on Admission:  Basic Metabolic Panel:  Recent Labs Lab 07/30/15 1000  NA 136  K 3.9  CL 101  CO2 26  GLUCOSE 287*  BUN 11  CREATININE 0.70  CALCIUM 9.8   Liver Function Tests:  Recent Labs Lab 07/30/15 1000  AST 22  ALT 19  ALKPHOS 59  BILITOT 0.4  PROT 6.9  ALBUMIN 3.9   CBC:  Recent Labs Lab 07/30/15 1000  WBC 5.2  HGB 13.1  HCT 38.4  MCV 87.3  PLT 220    Radiological Exams on Admission: Dg Chest Portable 1 View  07/30/2015  CLINICAL DATA:  Shortness of breath. EXAM: PORTABLE CHEST 1 VIEW COMPARISON:  CT 06/04/2015. FINDINGS: Mediastinum and hilar structures normal. Mild right base subsegmental atelectasis. Heart size normal. No pleural effusion or pneumothorax. No acute bony abnormality. IMPRESSION: No acute cardiopulmonary disease. Electronically Signed   By: Maisie Fus  Register   On: 07/30/2015 10:40    EKG: Independently reviewed.No acute ST changes. Compared with previous 06/04/15, noted abnormal R-wave progression, early transition.  Assessment/Plan Principal Problem:   Heart palpitations Active Problems:   Angina at rest [facility])   Dysphagia   Diabetes type 2, uncontrolled (HCC)   HTN (hypertension)   HLD (hyperlipidemia)   Heart palpitations with angina at rest: Patient has felt heart racing and pounding intermittently since 10/28 with burning chest pain that started 11/1 that wakes her from her sleep and is intermittent. Differential includes cardiac vs cervical radiculopathy vs thyroid disease. Troponin  negative. EKG noted abnormal R-wave progression. Repeat troponin and EKG. TSH pending. Patient has a HEART score of 5. Admit to telemetry for cardiac workup. Consult cardiology  Dysphagia: Difficult in swallowing solids.Esophagram pending.   Diabetes mellitus: hold metformin and glipizide. Follow sugars. A1C pending. Sliding scale insulin w/ meal coverage.  Hypertension: Stable. Continue on atenolol,enalapril and nifedipine.  Holding parameters of systolic lower than 100 and pulse <60.  Hyperlipidemia: Continue on simvastatin. Lipid panel.  Code Status: Full code DVT Prophylaxis: lovenox Family Communication: none at bedside. Patient is alert, orientated and understands their plan of care. Disposition Plan: discharge to home after cardiac workup Time spent: 75 minutes  Edgar Frisk PA-S [facility]  [place], [place] [facility] Pager: [phone]

## 2015-07-30 NOTE — ED Provider Notes (Signed)
CSN: [id number]     Arrival date & time 07/30/15  [id number] History   First MD Initiated Contact with Patient 07/30/15 0945     Chief Complaint  Patient presents with  . Chest Pain   HPI Patient presents to the emergency room for evaluation of chest pain and palpitations. Symptoms started last Friday. She was noticing palpitations in her chest. She noticed oftentimes in the evenings that her heart would be pounding and racing. She can visibly see her chest pounding. These symptoms would come and go. They would last maybe an hour or so. Nothing in particular what brings them on. She started developing pain in her chest since last night. The pain was in the left side and into her left arm. It is an achy feeling. This would last maybe an hour or so. She does feel short of breath when it occurs. Denies any trouble with diaphoresis or vomiting. No swelling. She has no history of heart disease. She does have history of hypertension and diabetes and high cholesterol. Past Medical History  Diagnosis Date  . Hypertension   . Diabetes mellitus without complication (HCC)   . Hyperlipemia    History reviewed. No pertinent past surgical history. No family history on file. Social History  Substance Use Topics  . Smoking status: Never Smoker   . Smokeless tobacco: None  . Alcohol Use: No   OB History    No data available     Review of Systems  All other systems reviewed and are negative.     Allergies  Review of patient's allergies indicates no known allergies.  Home Medications   Prior to Admission medications   Medication Sig Start Date End Date Taking? Authorizing Provider  aspirin EC 81 MG tablet Take 81 mg by mouth daily.   Yes Historical Provider, MD  atenolol (TENORMIN) 50 MG tablet Take 50 mg by mouth daily.   Yes Historical Provider, MD  enalapril (VASOTEC) 10 MG tablet Take 10 mg by mouth 2 (two) times daily.   Yes Historical Provider, MD  glipiZIDE (GLUCOTROL) 10 MG tablet Take 10 mg  by mouth 2 (two) times daily before a meal.   Yes Historical Provider, MD  metFORMIN (GLUCOPHAGE) 500 MG tablet Take 1,000 mg by mouth 2 (two) times daily with a meal.   Yes Historical Provider, MD  NIFEdipine (PROCARDIA) 20 MG capsule Take 20 mg by mouth 2 (two) times daily.   Yes Historical Provider, MD  simvastatin (ZOCOR) 40 MG tablet Take 40 mg by mouth daily at 6 PM.   Yes Historical Provider, MD   BP 106/63 mmHg  Pulse 89  Temp(Src) 98.3 F (36.8 C) (Oral)  Resp 23  Ht 5\' 2"  (1.575 m)  Wt 164 lb (74.39 kg)  BMI 29.99 kg/m2  SpO2 100% Physical Exam  Constitutional: She appears well-developed and well-nourished. No distress.  HENT:  Head: Normocephalic and atraumatic.  Right Ear: External ear normal.  Left Ear: External ear normal.  Eyes: Conjunctivae are normal. Right eye exhibits no discharge. Left eye exhibits no discharge. No scleral icterus.  Neck: Neck supple. No tracheal deviation present.  Cardiovascular: Normal rate, regular rhythm and intact distal pulses.   Pulmonary/Chest: Effort normal and breath sounds normal. No stridor. No respiratory distress. She has no wheezes. She has no rales.  Abdominal: Soft. Bowel sounds are normal. She exhibits no distension. There is no tenderness. There is no rebound and no guarding.  Musculoskeletal: She exhibits no edema or tenderness.  Neurological: She is alert. She has normal strength. No cranial nerve deficit (no facial droop, extraocular movements intact, no slurred speech) or sensory deficit. She exhibits normal muscle tone. She displays no seizure activity. Coordination normal.  Skin: Skin is warm and dry. No rash noted.  Psychiatric: She has a normal mood and affect.  Nursing note and vitals reviewed.   ED Course  Procedures (including critical care time) Labs Review Labs Reviewed  COMPREHENSIVE METABOLIC PANEL - Abnormal; Notable for the following:    Glucose, Bld 287 (*)    All other components within normal limits   APTT  CBC  PROTIME-INR  I-STAT TROPOININ, ED    Imaging Review Dg Chest Portable 1 View  07/30/2015  CLINICAL DATA:  Shortness of breath. EXAM: PORTABLE CHEST 1 VIEW COMPARISON:  CT 06/04/2015. FINDINGS: Mediastinum and hilar structures normal. Mild right base subsegmental atelectasis. Heart size normal. No pleural effusion or pneumothorax. No acute bony abnormality. IMPRESSION: No acute cardiopulmonary disease. Electronically Signed   By: Maisie Fus  Register   On: 07/30/2015 10:40   I have personally reviewed and evaluated these images and lab results as part of my medical decision-making.   EKG Interpretation   Date/Time:  Wednesday July 30 2015 09:38:55 EDT Ventricular Rate:  85 PR Interval:  181 QRS Duration: 64 QT Interval:  333 QTC Calculation: 396 R Axis:   80 Text Interpretation:  Sinus rhythm Abnormal R-wave progression, early  transition;  Confirmed by Sabriya Yono  MD-J, Wm Fruchter ([id number]) on 07/30/2015 9:44:59 AM      MDM   Final diagnoses:  Chest pain, unspecified chest pain type    Patient has a heart score 5. This puts her at moderate risk. Patient's symptoms are concerning for the possibility of angina. Right now she is comfortable in no distress. I will consult the medical service regarding admission for cardiac observation and discussion of further stress testing.    Linwood Dibbles, MD 07/30/15 [phone]

## 2015-07-30 NOTE — ED Notes (Signed)
Pt. Reports having intermittent periods of feeling like her heart is racing Since Friday.  Last night she developed lt. Side chest pain into her lt. Arm  She describes as achy feeling.  Nothing makes the pain feels better or worse.   Pt. Does  Report feeling intermittent sob.  She denies any cold symptoms or coughs.  Alert and oriented X4

## 2015-07-30 NOTE — ED Notes (Signed)
Attempted to call Report unsuccessful

## 2015-07-31 ENCOUNTER — Observation Stay (HOSPITAL_BASED_OUTPATIENT_CLINIC_OR_DEPARTMENT_OTHER): Payer: Medicaid Other

## 2015-07-31 ENCOUNTER — Other Ambulatory Visit: Payer: Self-pay | Admitting: Cardiology

## 2015-07-31 ENCOUNTER — Observation Stay (HOSPITAL_COMMUNITY): Payer: Medicaid Other

## 2015-07-31 ENCOUNTER — Other Ambulatory Visit (HOSPITAL_COMMUNITY): Payer: Medicaid Other

## 2015-07-31 DIAGNOSIS — E1165 Type 2 diabetes mellitus with hyperglycemia: Secondary | ICD-10-CM

## 2015-07-31 DIAGNOSIS — R002 Palpitations: Secondary | ICD-10-CM

## 2015-07-31 DIAGNOSIS — R1310 Dysphagia, unspecified: Secondary | ICD-10-CM

## 2015-07-31 DIAGNOSIS — I208 Other forms of angina pectoris: Secondary | ICD-10-CM | POA: Diagnosis not present

## 2015-07-31 DIAGNOSIS — R079 Chest pain, unspecified: Secondary | ICD-10-CM

## 2015-07-31 DIAGNOSIS — I10 Essential (primary) hypertension: Secondary | ICD-10-CM

## 2015-07-31 DIAGNOSIS — E118 Type 2 diabetes mellitus with unspecified complications: Secondary | ICD-10-CM | POA: Diagnosis not present

## 2015-07-31 LAB — NM MYOCAR MULTI W/SPECT W/WALL MOTION / EF
CHL CUP MPHR: 154 {beats}/min
CSEPHR: 105 %
CSEPPHR: 162 {beats}/min
Estimated workload: 9.3 METS
Exercise duration (min): 7 min
Exercise duration (sec): 31 s
Rest HR: 101 {beats}/min

## 2015-07-31 LAB — GLUCOSE, CAPILLARY
GLUCOSE-CAPILLARY: 122 mg/dL — AB (ref 65–99)
Glucose-Capillary: 308 mg/dL — ABNORMAL HIGH (ref 65–99)

## 2015-07-31 LAB — TROPONIN I: Troponin I: <0.03 ng/mL (ref <0.031)

## 2015-07-31 LAB — HEMOGLOBIN A1C
Hgb A1c MFr Bld: 8.6 % — ABNORMAL HIGH (ref 4.8–5.6)
MEAN PLASMA GLUCOSE: 200 mg/dL

## 2015-07-31 MED ORDER — TECHNETIUM TC 99M SESTAMIBI - CARDIOLITE
30.0000 | Freq: Once | INTRAVENOUS | Status: AC | PRN
  Administered 2015-07-31: 09:00:00 30 via INTRAVENOUS

## 2015-07-31 MED ORDER — TECHNETIUM TC 99M SESTAMIBI GENERIC - CARDIOLITE
10.0000 | Freq: Once | INTRAVENOUS | Status: AC | PRN
  Administered 2015-07-31: 10 via INTRAVENOUS

## 2015-07-31 NOTE — Progress Notes (Signed)
Stress myoview with breast attenuation and no ischemia.  Normal LVF.  No further inpt workup.  Will set up outpt event monitor to assess palpitations.  Will sign off. Call with any questions.

## 2015-07-31 NOTE — Discharge Summary (Signed)
Physician Discharge Summary  Kim Gibson ONG:[id number] DOB: [birth year] DOA: 07/30/2015  PCP: Cleotis Nipper, FNP  Admit date: 07/30/2015 Discharge date: 07/31/2015  Time spent: < 30 minutes  Recommendations for Outpatient Follow-up:  1. Follow up with PCP in 2-4 weeks   Discharge Diagnoses:  Principal Problem:   Heart palpitations Active Problems:   Angina at rest [facility])   Dysphagia   Diabetes type 2, uncontrolled (HCC)   HTN (hypertension)   HLD (hyperlipidemia)  Discharge Condition: stable  Diet recommendation: heart healthy  Filed Weights   07/30/15 0946 07/30/15 1448 07/31/15 0400  Weight: 74.39 kg (164 lb) 67.903 kg (149 lb 11.2 oz) 66.089 kg (145 lb 11.2 oz)   History of present illness:  See H&P, Labs, Consult and Test reports for all details in brief, patient is a 64 yo F with HTN, DM, HLD admitted with chest pain.  Hospital Course:  Chest pain with palpitations - cardiology was consulted and have followed patient while hospitalized. She underwent a stress test which was negative for ischemia. Per cardiology recommending no further inpatient workup. Patient will be set up as an outpatient for event monitor given ongoing palpitations prior to admission. She also underwent a 2D echo which showed normal EF and grade 1 diastolic dysfunction. Patient was chest pain free and was discharged home in stable condition with outpatient follow up. Dysphagia: Difficult in swallowing solids.Esophagram without strictures. Outpatient workup.  Diabetes mellitus: resume home medications Hypertension: Stable.  Hyperlipidemia: Continue on simvastatin.  Procedures:  2D echo Study Conclusions - Left ventricle: The cavity size was normal. Wall thickness wasnormal. Systolic function was normal. The estimated ejectionfraction was in the range of 60% to 65%. Wall motion was normal;there were no regional wall motion abnormalities. Dopplerparameters are consistent with abnormal left  ventricularrelaxation (grade 1 diastolic dysfunction). The E/e&' ratio isbetween 8-15, suggesting indeterminate LV filling pressure. - Mitral valve: Calcified annulus. There was trivial regurgitation. - Left atrium: The atrium was normal in size. - Right atrium: The atrium was at the upper limits of normal insize. - Atrial septum: Aneurysmal IAS - cannot exclude PFO. - Tricuspid valve: There was no significant regurgitation. - Inferior vena cava: The vessel was normal in size. Therespirophasic diameter changes were in the normal range (>= 50%), consistent with normal central venous pressure.   Consultations:  Cardiology   Discharge Exam: Filed Vitals:   07/31/15 4401 07/31/15 0940 07/31/15 0941 07/31/15 1111  BP: 163/102 140/100 140/100 128/67  Pulse:   94 84  Temp:    98.1 F (36.7 C)  TempSrc:    Oral  Resp:    16  Height:      Weight:      SpO2:    98%    General: NAD Cardiovascular: RRR Respiratory: CTA biL  Discharge Instructions Activity:  As tolerated   Get Medicines reviewed and adjusted: Please take all your medications with you for your next visit with your Primary MD  Please request your Primary MD to go over all hospital tests and procedure/radiological results at the follow up, please ask your Primary MD to get all Hospital records sent to his/her office.  If you experience worsening of your admission symptoms, develop shortness of breath, life threatening emergency, suicidal or homicidal thoughts you must seek medical attention immediately by calling 911 or calling your MD immediately if symptoms less severe.  You must read complete instructions/literature along with all the possible adverse reactions/side effects for all the Medicines you take and  that have been prescribed to you. Take any new Medicines after you have completely understood and accpet all the possible adverse reactions/side effects.   Do not drive when taking Pain medications.   Do not  take more than prescribed Pain, Sleep and Anxiety Medications  Special Instructions: If you have smoked or chewed Tobacco in the last 2 yrs please stop smoking, stop any regular Alcohol and or any Recreational drug use.  Wear Seat belts while driving.  Please note  You were cared for by a hospitalist during your hospital stay. Once you are discharged, your primary care physician will handle any further medical issues. Please note that NO REFILLS for any discharge medications will be authorized once you are discharged, as it is imperative that you return to your primary care physician (or establish a relationship with a primary care physician if you do not have one) for your aftercare needs so that they can reassess your need for medications and monitor your lab values.    Medication List    TAKE these medications        aspirin EC 81 MG tablet  Take 81 mg by mouth daily.     atenolol 50 MG tablet  Commonly known as:  TENORMIN  Take 50 mg by mouth daily.     enalapril 10 MG tablet  Commonly known as:  VASOTEC  Take 10 mg by mouth 2 (two) times daily.     glipiZIDE 10 MG tablet  Commonly known as:  GLUCOTROL  Take 10 mg by mouth 2 (two) times daily before a meal.     metFORMIN 500 MG tablet  Commonly known as:  GLUCOPHAGE  Take 1,000 mg by mouth 2 (two) times daily with a meal.     NIFEdipine 20 MG capsule  Commonly known as:  PROCARDIA  Take 20 mg by mouth 2 (two) times daily.     simvastatin 40 MG tablet  Commonly known as:  ZOCOR  Take 40 mg by mouth daily at 6 PM.           Follow-up Information    [facility]   Specialty:  Cardiology   Why:  our office will call you with a follow-up appointment and will call for a time for you to pick up a heart monitor    Contact information:   [address], [address] [place] [phone]      [facility] F[facility]. Schedule an appointment  as soon as possible for a visit in 2 weeks.   Specialty:  Nurse Practitioner   Contact information:   [address] [place] [place] [id number] [phone]       The results of significant diagnostics from this hospitalization (including imaging, microbiology, ancillary and laboratory) are listed below for reference.    Significant Diagnostic Studies: Dg Chest Portable 1 View  07/30/2015  CLINICAL DATA:  Shortness of breath. EXAM: PORTABLE CHEST 1 VIEW COMPARISON:  CT 06/04/2015. FINDINGS: Mediastinum and hilar structures normal. Mild right base subsegmental atelectasis. Heart size normal. No pleural effusion or pneumothorax. No acute bony abnormality. IMPRESSION: No acute cardiopulmonary disease. Electronically Signed   By: Maisie Fushomas  Register   On: 07/30/2015 10:40   Dg Esophagus W/water Sol Cm  07/30/2015  CLINICAL DATA:  Initial encounter for chest pain radiating down left arm. EXAM: ESOPHOGRAM/BARIUM SWALLOW TECHNIQUE: Single contrast examination was performed using water-soluble contrast. FLUOROSCOPY TIME:  Radiation Exposure  Index (as provided by the fluoroscopic device): If the device does not provide the exposure index: Fluoroscopy Time:  0 minutes and 18 seconds Number of Acquired Images: COMPARISON:  None. FINDINGS: Water-soluble contrast esophagram shows no evidence for esophageal stricture or mass. No esophageal diverticulum. No evidence for contrast extravasation from the esophagus. 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 IMPRESSION: No evidence for contrast extravasation. No gross esophageal stricture or mass. Small diverticulum of the distal esophagus. Electronically Signed   By: Kennith Center M.D.   On: 07/30/2015 14:46   Labs: Basic Metabolic Panel:  Recent Labs Lab 07/30/15 1000  NA 136  K 3.9  CL 101  CO2 26  GLUCOSE 287*  BUN 11  CREATININE 0.70  CALCIUM 9.8   Liver Function Tests:  Recent Labs Lab 07/30/15 1000  AST 22  ALT 19  ALKPHOS 59  BILITOT 0.4   PROT 6.9  ALBUMIN 3.9   CBC:  Recent Labs Lab 07/30/15 1000  WBC 5.2  HGB 13.1  HCT 38.4  MCV 87.3  PLT 220   Cardiac Enzymes:  Recent Labs Lab 07/30/15 1258 07/30/15 1907 07/31/15 0049  TROPONINI <0.03 <0.03 <0.03   BNP: BNP (last 3 results)  Recent Labs  07/30/15 1253  BNP 17.5   CBG:  Recent Labs Lab 07/30/15 1502 07/30/15 1608 07/30/15 2107 07/31/15 1108  GLUCAP 94 117* 251* 308*    Signed:  GHERGHE, COSTIN  [facility] 07/31/2015, 4:07 PM

## 2015-07-31 NOTE — Discharge Instructions (Signed)
Follow with Kim NipperNYKEDTRA M BROWN, FNP in 5-7 days  Please get a complete blood count and chemistry panel checked by your Primary MD at your next visit, and again as instructed by your Primary MD. Please get your medications reviewed and adjusted by your Primary MD.  Please request your Primary MD to go over all Hospital Tests and Procedure/Radiological results at the follow up, please get all Hospital records sent to your Prim MD by signing hospital release before you go home.  If you had Pneumonia of Lung problems at the Hospital: Please get a 2 view Chest X ray done in 6-8 weeks after hospital discharge or sooner if instructed by your Primary MD.  If you have Congestive Heart Failure: Please call your Cardiologist or Primary MD anytime you have any of the following symptoms:  1) 3 pound weight gain in 24 hours or 5 pounds in 1 week  2) shortness of breath, with or without a dry hacking cough  3) swelling in the hands, feet or stomach  4) if you have to sleep on extra pillows at night in order to breathe  Follow cardiac low salt diet and 1.5 lit/day fluid restriction.  If you have diabetes Accuchecks 4 times/day, Once in AM empty stomach and then before each meal. Log in all results and show them to your primary doctor at your next visit. If any glucose reading is under 80 or above 300 call your primary MD immediately.  If you have Seizure/Convulsions/Epilepsy: Please do not drive, operate heavy machinery, participate in activities at heights or participate in high speed sports until you have seen by Primary MD or a Neurologist and advised to do so again.  If you had Gastrointestinal Bleeding: Please ask your Primary MD to check a complete blood count within one week of discharge or at your next visit. Your endoscopic/colonoscopic biopsies that are pending at the time of discharge, will also need to followed by your Primary MD.  Get Medicines reviewed and adjusted. Please take all your  medications with you for your next visit with your Primary MD  Please request your Primary MD to go over all hospital tests and procedure/radiological results at the follow up, please ask your Primary MD to get all Hospital records sent to his/her office.  If you experience worsening of your admission symptoms, develop shortness of breath, life threatening emergency, suicidal or homicidal thoughts you must seek medical attention immediately by calling 911 or calling your MD immediately  if symptoms less severe.  You must read complete instructions/literature along with all the possible adverse reactions/side effects for all the Medicines you take and that have been prescribed to you. Take any new Medicines after you have completely understood and accpet all the possible adverse reactions/side effects.   Do not drive or operate heavy machinery when taking Pain medications.   Do not take more than prescribed Pain, Sleep and Anxiety Medications  Special Instructions: If you have smoked or chewed Tobacco  in the last 2 yrs please stop smoking, stop any regular Alcohol  and or any Recreational drug use.  Wear Seat belts while driving.  Please note You were cared for by a hospitalist during your hospital stay. If you have any questions about your discharge medications or the care you received while you were in the hospital after you are discharged, you can call the unit and asked to speak with the hospitalist on call if the hospitalist that took care of you is not available.  Once you are discharged, your primary care physician will handle any further medical issues. Please note that NO REFILLS for any discharge medications will be authorized once you are discharged, as it is imperative that you return to your primary care physician (or establish a relationship with a primary care physician if you do not have one) for your aftercare needs so that they can reassess your need for medications and monitor your  lab values.  You can reach the hospitalist office at phone [phone] or fax [phone]   If you do not have a primary care physician, you can call [phone] for a physician referral.  Activity: As tolerated with Full fall precautions use walker/cane & assistance as needed  Diet: regular  Disposition Home

## 2015-07-31 NOTE — Progress Notes (Signed)
Pt without chest pain or SOB  Cardiac Enzymes: Recent Labs  07/30/15 1258 07/30/15 1907 07/31/15 0049  TROPONINI <0.03 <0.03 <0.03   OK for Myoview.  GXT MV performed, pt BP elevated at the end, but target reached. Pt had chest pain at max exertion, 2/10. No ST elevation, no clear ST depression.  Images and report pending.  Melida QuitterSigned, Barrett, Rhonda , PA-C 9:23 AM 07/31/2015

## 2015-07-31 NOTE — Progress Notes (Signed)
  Echocardiogram 2D Echocardiogram has been performed.  Tye SavoyCasey N Elige Shouse 07/31/2015, 3:57 PM

## 2015-08-04 ENCOUNTER — Telehealth: Payer: Self-pay | Admitting: Cardiology

## 2015-08-04 NOTE — Telephone Encounter (Signed)
-----   Message from Allayne ButcherBrittainy M Simmons, [place]C sent at 07/31/2015  3:54 PM EDT ----- Regarding: heart monitor and f/u 30 day monitor, Per Dr. Mayford Knifeurner for palpitations. Please schedule office f/u in 4-5 weeks to review results and reassess. Order is in. Thanks.

## 2015-08-04 NOTE — Telephone Encounter (Signed)
New message   Received this message from [place] Brittany. Forwarding to you to schedule monitor appt. Please assist.

## 2015-08-15 ENCOUNTER — Other Ambulatory Visit: Payer: Self-pay | Admitting: *Deleted

## 2015-08-15 DIAGNOSIS — R002 Palpitations: Secondary | ICD-10-CM

## 2015-08-19 ENCOUNTER — Telehealth: Payer: Self-pay | Admitting: Cardiology

## 2015-08-19 ENCOUNTER — Other Ambulatory Visit: Payer: Self-pay | Admitting: Cardiology

## 2015-08-19 DIAGNOSIS — R002 Palpitations: Secondary | ICD-10-CM

## 2015-08-19 NOTE — Telephone Encounter (Signed)
New message      FYI Pt was called to come in for event monitor and schedule follow up appt.  Pt told us [place] and would get the monitor when she returns.

## 2015-12-29 ENCOUNTER — Telehealth: Payer: Self-pay | Admitting: Cardiology

## 2015-12-29 DIAGNOSIS — R002 Palpitations: Secondary | ICD-10-CM

## 2015-12-29 NOTE — Telephone Encounter (Signed)
NewMessage  Pt (per phone note in NOv/2016) is followed by Dr Mayford Knifeurner- was supposed to have heart monitor in November but never scheduled- no order in syst- pt wanted to know what to do going forward. Please call back and discuss.

## 2015-12-30 NOTE — Telephone Encounter (Signed)
Kim CockayneShanterra M Gibson at 08/04/2015 11:54 AM     Status: Signed       Expand All Collapse All   ----- Message from Allayne ButcherBrittainy M Simmons, PA-C sent at 07/31/2015 3:54 PM EDT ----- Regarding: heart monitor and f/u 30 day monitor, Per Dr. Mayford Knifeurner for palpitations. Please schedule office f/u in 4-5 weeks to review results and reassess. Order is in. Thanks.           30 day monitor ordered for scheduling. Patient to schedule [facility] appointment [facility].

## 2016-02-11 ENCOUNTER — Ambulatory Visit (INDEPENDENT_AMBULATORY_CARE_PROVIDER_SITE_OTHER): Payer: Medicaid Other

## 2016-02-11 DIAGNOSIS — R002 Palpitations: Secondary | ICD-10-CM

## 2016-05-08 ENCOUNTER — Telehealth: Payer: Self-pay | Admitting: Nurse Practitioner

## 2016-05-08 NOTE — Telephone Encounter (Signed)
   Pt called to report that she never received notification re: the result of an event monitor that she wore in June, and also that she is having chest pain today.  I reviewed her event monitor report and noted that it was reported out to [employer].  She says that she doesn't check mychart.  I advised that the study was nl.  Re: c/p, I rec that she would need to be evaluated today and as it is the weekend, the ER is her best option.  She inferred that she would consider going to the ED today.  Nicolasa Duckinghristopher Aanyah Loa, NP 05/08/2016, 11:14 AM

## 2016-07-08 ENCOUNTER — Other Ambulatory Visit: Payer: Self-pay | Admitting: Nurse Practitioner

## 2016-07-08 DIAGNOSIS — Z1231 Encounter for screening mammogram for malignant neoplasm of breast: Secondary | ICD-10-CM

## 2016-07-20 ENCOUNTER — Ambulatory Visit
Admission: RE | Admit: 2016-07-20 | Discharge: 2016-07-20 | Disposition: A | Discharge status: Home / Self Care | Payer: Medicaid Other | Source: Ambulatory Visit | Attending: Nurse Practitioner | Admitting: Nurse Practitioner

## 2016-07-20 DIAGNOSIS — Z1231 Encounter for screening mammogram for malignant neoplasm of breast: Secondary | ICD-10-CM

## 2016-09-24 ENCOUNTER — Encounter (HOSPITAL_COMMUNITY)
Admission: AD | Disposition: A | Discharge status: Home / Self Care | Payer: Self-pay | Source: Ambulatory Visit | Attending: Cardiovascular Disease

## 2016-09-24 ENCOUNTER — Observation Stay (HOSPITAL_COMMUNITY)
Admission: AD | Admit: 2016-09-24 | Discharge: 2016-09-25 | Disposition: A | Discharge status: Home / Self Care | Payer: Medicaid Other | Source: Ambulatory Visit | Attending: Cardiovascular Disease | Admitting: Cardiovascular Disease

## 2016-09-24 DIAGNOSIS — M1990 Unspecified osteoarthritis, unspecified site: Secondary | ICD-10-CM | POA: Diagnosis not present

## 2016-09-24 DIAGNOSIS — G4700 Insomnia, unspecified: Secondary | ICD-10-CM | POA: Diagnosis not present

## 2016-09-24 DIAGNOSIS — E1165 Type 2 diabetes mellitus with hyperglycemia: Secondary | ICD-10-CM | POA: Insufficient documentation

## 2016-09-24 DIAGNOSIS — IMO0002 Reserved for concepts with insufficient information to code with codable children: Secondary | ICD-10-CM | POA: Diagnosis present

## 2016-09-24 DIAGNOSIS — Z7984 Long term (current) use of oral hypoglycemic drugs: Secondary | ICD-10-CM | POA: Diagnosis not present

## 2016-09-24 DIAGNOSIS — I208 Other forms of angina pectoris: Secondary | ICD-10-CM | POA: Diagnosis present

## 2016-09-24 DIAGNOSIS — E785 Hyperlipidemia, unspecified: Secondary | ICD-10-CM | POA: Diagnosis not present

## 2016-09-24 DIAGNOSIS — I25110 Atherosclerotic heart disease of native coronary artery with unstable angina pectoris: Secondary | ICD-10-CM | POA: Diagnosis not present

## 2016-09-24 DIAGNOSIS — R002 Palpitations: Secondary | ICD-10-CM | POA: Diagnosis not present

## 2016-09-24 DIAGNOSIS — Z7982 Long term (current) use of aspirin: Secondary | ICD-10-CM | POA: Insufficient documentation

## 2016-09-24 DIAGNOSIS — I10 Essential (primary) hypertension: Secondary | ICD-10-CM | POA: Diagnosis not present

## 2016-09-24 DIAGNOSIS — E119 Type 2 diabetes mellitus without complications: Secondary | ICD-10-CM | POA: Diagnosis present

## 2016-09-24 DIAGNOSIS — Z8249 Family history of ischemic heart disease and other diseases of the circulatory system: Secondary | ICD-10-CM | POA: Diagnosis not present

## 2016-09-24 DIAGNOSIS — I2089 Other forms of angina pectoris: Secondary | ICD-10-CM | POA: Diagnosis present

## 2016-09-24 DIAGNOSIS — R072 Precordial pain: Secondary | ICD-10-CM | POA: Diagnosis present

## 2016-09-24 HISTORY — PX: CARDIAC CATHETERIZATION: SHX172

## 2016-09-24 LAB — CBC WITH DIFFERENTIAL/PLATELET
BASOS ABS: 0 10*3/uL (ref 0.0–0.1)
Basophils Relative: 0 %
EOS PCT: 3 %
Eosinophils Absolute: 0.2 10*3/uL (ref 0.0–0.7)
HEMATOCRIT: 35.4 % — AB (ref 36.0–46.0)
HEMOGLOBIN: 11.8 g/dL — AB (ref 12.0–15.0)
LYMPHS ABS: 2.1 10*3/uL (ref 0.7–4.0)
LYMPHS PCT: 38 %
MCH: 28.8 pg (ref 26.0–34.0)
MCHC: 33.3 g/dL (ref 30.0–36.0)
MCV: 86.3 fL (ref 78.0–100.0)
Monocytes Absolute: 0.4 10*3/uL (ref 0.1–1.0)
Monocytes Relative: 7 %
NEUTROS ABS: 3 10*3/uL (ref 1.7–7.7)
NEUTROS PCT: 52 %
PLATELETS: 223 10*3/uL (ref 150–400)
RBC: 4.1 MIL/uL (ref 3.87–5.11)
RDW: 13.8 % (ref 11.5–15.5)
WBC: 5.7 10*3/uL (ref 4.0–10.5)

## 2016-09-24 LAB — COMPREHENSIVE METABOLIC PANEL
ALBUMIN: 3.6 g/dL (ref 3.5–5.0)
ALK PHOS: 50 U/L (ref 38–126)
ALT: 19 U/L (ref 14–54)
ANION GAP: 8 (ref 5–15)
AST: 22 U/L (ref 15–41)
BUN: 16 mg/dL (ref 6–20)
CHLORIDE: 104 mmol/L (ref 101–111)
CO2: 26 mmol/L (ref 22–32)
Calcium: 9.5 mg/dL (ref 8.9–10.3)
Creatinine, Ser: 0.76 mg/dL (ref 0.44–1.00)
GFR calc non Af Amer: >60 mL/min (ref >60)
GLUCOSE: 130 mg/dL — AB (ref 65–99)
Potassium: 3.7 mmol/L (ref 3.5–5.1)
SODIUM: 138 mmol/L (ref 135–145)
Total Bilirubin: 0.3 mg/dL (ref 0.3–1.2)
Total Protein: 6.9 g/dL (ref 6.5–8.1)

## 2016-09-24 LAB — TROPONIN I: Troponin I: <0.03 ng/mL (ref <0.03)

## 2016-09-24 LAB — HEPARIN LEVEL (UNFRACTIONATED): Heparin Unfractionated: 0.31 IU/mL (ref 0.30–0.70)

## 2016-09-24 LAB — POCT ACTIVATED CLOTTING TIME: ACTIVATED CLOTTING TIME: 147 s

## 2016-09-24 SURGERY — LEFT HEART CATH AND CORONARY ANGIOGRAPHY
Anesthesia: LOCAL

## 2016-09-24 MED ORDER — IOPAMIDOL (ISOVUE-370) INJECTION 76%
INTRAVENOUS | Status: AC
  Filled 2016-09-24: qty 100

## 2016-09-24 MED ORDER — ASPIRIN 81 MG PO CHEW: 324.0000 mg | CHEWABLE_TABLET | ORAL | Status: DC

## 2016-09-24 MED ORDER — HEPARIN (PORCINE) IN NACL 100-0.45 UNIT/ML-% IJ SOLN
800.0000 [IU]/h | INTRAMUSCULAR | Status: DC
  Administered 2016-09-24 (×2): 800 [IU]/h via INTRAVENOUS
  Filled 2016-09-24 (×2): qty 250

## 2016-09-24 MED ORDER — ASPIRIN EC 81 MG PO TBEC
81.0000 mg | DELAYED_RELEASE_TABLET | Freq: Every day | ORAL | Status: DC
  Administered 2016-09-24 – 2016-09-25 (×2): 81 mg via ORAL
  Filled 2016-09-24 (×2): qty 1

## 2016-09-24 MED ORDER — ASPIRIN 300 MG RE SUPP: 300.0000 mg | RECTAL | Status: DC

## 2016-09-24 MED ORDER — HEPARIN (PORCINE) IN NACL 2-0.9 UNIT/ML-% IJ SOLN
INTRAMUSCULAR | Status: DC | PRN
  Administered 2016-09-24: 19:00:00

## 2016-09-24 MED ORDER — SODIUM CHLORIDE 0.9 % IV SOLN: 250.0000 mL | INTRAVENOUS | Status: DC | PRN

## 2016-09-24 MED ORDER — ACETAMINOPHEN 325 MG PO TABS: 650.0000 mg | ORAL_TABLET | ORAL | Status: DC | PRN

## 2016-09-24 MED ORDER — IOPAMIDOL (ISOVUE-370) INJECTION 76%
INTRAVENOUS | Status: DC | PRN
  Administered 2016-09-24: 40 mL

## 2016-09-24 MED ORDER — ENALAPRIL MALEATE 10 MG PO TABS
10.0000 mg | ORAL_TABLET | Freq: Two times a day (BID) | ORAL | Status: DC
  Administered 2016-09-24 – 2016-09-25 (×2): 10 mg via ORAL
  Filled 2016-09-24 (×2): qty 1

## 2016-09-24 MED ORDER — SODIUM CHLORIDE 0.9 % IV SOLN
INTRAVENOUS | Status: AC
  Administered 2016-09-24: 20:00:00 via INTRAVENOUS

## 2016-09-24 MED ORDER — SODIUM CHLORIDE 0.9 % IV SOLN
INTRAVENOUS | Status: DC
  Administered 2016-09-24: 100 mL/h via INTRAVENOUS

## 2016-09-24 MED ORDER — SODIUM CHLORIDE 0.9% FLUSH
3.0000 mL | Freq: Two times a day (BID) | INTRAVENOUS | Status: DC
  Administered 2016-09-24: 3 mL via INTRAVENOUS

## 2016-09-24 MED ORDER — SIMVASTATIN 40 MG PO TABS: 40.0000 mg | ORAL_TABLET | Freq: Every day | ORAL | Status: DC

## 2016-09-24 MED ORDER — MIDAZOLAM HCL 2 MG/2ML IJ SOLN
INTRAMUSCULAR | Status: AC
  Filled 2016-09-24: qty 2

## 2016-09-24 MED ORDER — LIDOCAINE HCL (PF) 1 % IJ SOLN
INTRAMUSCULAR | Status: DC | PRN
  Administered 2016-09-24: 15 mL

## 2016-09-24 MED ORDER — SODIUM CHLORIDE 0.9% FLUSH: 3.0000 mL | INTRAVENOUS | Status: DC | PRN

## 2016-09-24 MED ORDER — MIDAZOLAM HCL 2 MG/2ML IJ SOLN
INTRAMUSCULAR | Status: DC | PRN
  Administered 2016-09-24: 1 mg via INTRAVENOUS

## 2016-09-24 MED ORDER — FENTANYL CITRATE (PF) 100 MCG/2ML IJ SOLN
INTRAMUSCULAR | Status: AC
  Filled 2016-09-24: qty 2

## 2016-09-24 MED ORDER — ZOLPIDEM TARTRATE 5 MG PO TABS
5.0000 mg | ORAL_TABLET | Freq: Every evening | ORAL | Status: DC | PRN
  Administered 2016-09-25: 5 mg via ORAL
  Filled 2016-09-24: qty 1

## 2016-09-24 MED ORDER — FENTANYL CITRATE (PF) 100 MCG/2ML IJ SOLN
INTRAMUSCULAR | Status: DC | PRN
  Administered 2016-09-24: 25 ug via INTRAVENOUS

## 2016-09-24 MED ORDER — ONDANSETRON HCL 4 MG/2ML IJ SOLN: 4.0000 mg | Freq: Four times a day (QID) | INTRAMUSCULAR | Status: DC | PRN

## 2016-09-24 MED ORDER — HEPARIN BOLUS VIA INFUSION
3000.0000 [IU] | Freq: Once | INTRAVENOUS | Status: AC
  Administered 2016-09-24: 3000 [IU] via INTRAVENOUS
  Filled 2016-09-24: qty 3000

## 2016-09-24 MED ORDER — ATENOLOL 25 MG PO TABS
50.0000 mg | ORAL_TABLET | Freq: Every day | ORAL | Status: DC
  Administered 2016-09-25: 50 mg via ORAL
  Filled 2016-09-24: qty 2

## 2016-09-24 MED ORDER — NITROGLYCERIN 0.4 MG SL SUBL: 0.4000 mg | SUBLINGUAL_TABLET | SUBLINGUAL | Status: DC | PRN

## 2016-09-24 MED ORDER — HEPARIN (PORCINE) IN NACL 2-0.9 UNIT/ML-% IJ SOLN
INTRAMUSCULAR | Status: AC
  Filled 2016-09-24: qty 1000

## 2016-09-24 MED ORDER — SODIUM CHLORIDE 0.9% FLUSH: 3.0000 mL | Freq: Two times a day (BID) | INTRAVENOUS | Status: DC

## 2016-09-24 MED ORDER — LIDOCAINE HCL (PF) 1 % IJ SOLN
INTRAMUSCULAR | Status: AC
  Filled 2016-09-24: qty 30

## 2016-09-24 SURGICAL SUPPLY — 7 items
CATH INFINITI 5FR MULTPACK ANG (CATHETERS) ×2 IMPLANT
KIT HEART LEFT (KITS) ×2 IMPLANT
PACK CARDIAC CATHETERIZATION (CUSTOM PROCEDURE TRAY) ×2 IMPLANT
SHEATH PINNACLE 5F 10CM (SHEATH) ×2 IMPLANT
SYR MEDRAD MARK V 150ML (SYRINGE) ×2 IMPLANT
TRANSDUCER W/STOPCOCK (MISCELLANEOUS) ×2 IMPLANT
WIRE EMERALD 3MM-J .035X150CM (WIRE) ×2 IMPLANT

## 2016-09-24 NOTE — Progress Notes (Signed)
Patient arrived to 2W39 via direct admit.  Patient was oriented to unit and room to include phone and call light.  Telemetry monitor was applied and CCMD notified. Will continue to monitor.

## 2016-09-24 NOTE — Progress Notes (Signed)
ANTICOAGULATION CONSULT NOTE - Initial Consult  Pharmacy Consult for heparin Indication: chest pain/ACS  Allergies  Allergen Reactions  . Tape Itching    Patient Measurements: Height: 5\' 2"  (157.5 cm) Weight: 143 lb 14.4 oz (65.3 kg) IBW/kg (Calculated) : 50.1 Heparin Dosing Weight: 63 kg  Vital Signs: Temp: 98.4 F (36.9 C) (12/29 1545) Temp Source: Oral (12/29 1545) BP: 112/52 (12/29 1545) Pulse Rate: 74 (12/29 1545)  Labs: No results for input(s): HGB, HCT, PLT, APTT, LABPROT, INR, HEPARINUNFRC, HEPRLOWMOCWT, CREATININE, CKTOTAL, CKMB, TROPONINI in the last 72 hours.  CrCl cannot be calculated (Patient's most recent lab result is older than the maximum 21 days allowed.).   Medical History: Past Medical History:  Diagnosis Date  . Arthritis   . Diabetes mellitus without complication (HCC)   . Hyperlipemia   . Hypertension   . Insomnia     Medications:  Prescriptions Prior to Admission  Medication Sig Dispense Refill Last Dose  . aspirin EC 81 MG tablet Take 81 mg by mouth daily.   09/24/2016 at Unknown time  . atenolol (TENORMIN) 50 MG tablet Take 50 mg by mouth daily.   09/24/2016 at 0900  . enalapril (VASOTEC) 10 MG tablet Take 10 mg by mouth 2 (two) times daily.   09/24/2016 at 0900  . glipiZIDE (GLUCOTROL) 10 MG tablet Take 10 mg by mouth 2 (two) times daily before a meal.   09/24/2016 at 0900  . hydrochlorothiazide (HYDRODIURIL) 25 MG tablet Take 6.25 mg by mouth daily.   09/24/2016 at Unknown time  . metFORMIN (GLUCOPHAGE) 500 MG tablet Take 1,000 mg by mouth 2 (two) times daily with a meal.   09/24/2016 at 0900  . NIFEdipine (PROCARDIA) 20 MG capsule Take 20 mg by mouth 2 (two) times daily.   09/24/2016 at 0900  . simvastatin (ZOCOR) 40 MG tablet Take 40 mg by mouth daily at 6 PM.   09/23/2016 at Unknown time  . zolpidem (AMBIEN) 5 MG tablet Take 5 mg by mouth at bedtime as needed for sleep.   09/23/2016 at Unknown time    Assessment: 65 yo F direct  admit from MD office with chest pain. Pharmacy consulted to dose heparin for ACS/STEMI.  No anticoagulants PTA.  Wt 65.3 kg.  No labs drawn yet.   Goal of Therapy:  Heparin level 0.3-0.7 units/ml Monitor platelets by anticoagulation protocol: Yes   Plan:  Give 3000 units bolus x 1 Start heparin infusion at 800 units/hr Check anti-Xa level in 6 hours and daily while on heparin Continue to monitor H&H and platelets  Herby AbrahamMichelle T. Abrian Hanover, Pharm.D. [phone] 09/24/2016 4:28 PM

## 2016-09-24 NOTE — Progress Notes (Signed)
ANTICOAGULATION CONSULT NOTE - Follow-up Consult  Pharmacy Consult for heparin Indication: chest pain/ACS  Allergies  Allergen Reactions  . Tape Itching    Patient Measurements: Height: 5\' 2"  (157.5 cm) Weight: 143 lb 14.4 oz (65.3 kg) IBW/kg (Calculated) : 50.1 Heparin Dosing Weight: 63 kg  Vital Signs: Temp: 99 F (37.2 C) (12/29 1916) Temp Source: Oral (12/29 1916) BP: 124/68 (12/29 2200) Pulse Rate: 70 (12/29 2200)  Labs:  Recent Labs  09/24/16 1613 09/24/16 2213  HGB 11.8*  --   HCT 35.4*  --   PLT 223  --   HEPARINUNFRC  --  0.31  CREATININE 0.76  --   TROPONINI <0.03 <0.03    Estimated Creatinine Clearance: 60.5 mL/min (by C-G formula based on SCr of 0.76 mg/dL).   Assessment: 65 yo F on heparin for ACS/STEMI. Heparin level therapeutic (0.31) on gtt at 800 units/hr. CBC ok on admission.   Goal of Therapy:  Heparin level 0.3-0.7 units/ml Monitor platelets by anticoagulation protocol: Yes   Plan:  Continue heparin at 800 units/hr Will f/u a.m. heparin level to confirm therapeutic  Kim Gibson, PharmD, BCPS Clinical pharmacist, pager [phone] 09/24/2016 11:18 PM

## 2016-09-24 NOTE — H&P (Signed)
Referring Physician:  Leroy Kennedylizabeth Gibson is an 65 y.o. female.                       Chief Complaint: Chest pain  HPI: 65 year old female with PMH of type II DM, hypertension, hyperlipidemia, insomnia and arthritis has recurrent chest heaviness and fullness with palpitation. No fever or cough.  Past Medical History:  Diagnosis Date  . Arthritis   . Diabetes mellitus without complication (HCC)   . Hyperlipemia   . Hypertension   . Insomnia       Past Surgical History:  Procedure Laterality Date  . CESAREAN SECTION    . CORONARY ANGIOGRAM  2005    Family History  Problem Relation Age of Onset  . Heart attack Mother 5058  . Heart attack Father 2763  . Heart disease Brother    Social History:  reports that she has never smoked. She has never used smokeless tobacco. She reports that she does not drink alcohol or use drugs.  Allergies:  Allergies  Allergen Reactions  . Tape Itching    Medications Prior to Admission  Medication Sig Dispense Refill  . aspirin EC 81 MG tablet Take 81 mg by mouth daily.    Marland Kitchen. atenolol (TENORMIN) 50 MG tablet Take 50 mg by mouth daily.    . enalapril (VASOTEC) 10 MG tablet Take 10 mg by mouth 2 (two) times daily.    Marland Kitchen. glipiZIDE (GLUCOTROL) 10 MG tablet Take 10 mg by mouth 2 (two) times daily before a meal.    . hydrochlorothiazide (HYDRODIURIL) 25 MG tablet Take 6.25 mg by mouth daily.    . metFORMIN (GLUCOPHAGE) 500 MG tablet Take 1,000 mg by mouth 2 (two) times daily with a meal.    . NIFEdipine (PROCARDIA) 20 MG capsule Take 20 mg by mouth 2 (two) times daily.    . simvastatin (ZOCOR) 40 MG tablet Take 40 mg by mouth daily at 6 PM.    . zolpidem (AMBIEN) 5 MG tablet Take 5 mg by mouth at bedtime as needed for sleep.      No results found for this or any previous visit (from the past 48 hour(Gibson)). No results found.  Review Of Systems Constitutional: No fever, chills , weight loss or gain. Eyes: Vision change, Wears reading glasses. No discharge  or pain.. Ears: No hearing loss, No tinnitus. Respiratory: No asthma, COPD, pneumonias. Positive shortness of breath. No hemoptysis. Cardiovascular: Positive chest pain, palpitation or leg edema. Gastrointestinal: No nausea, vomiting or diarrhea or constipation. No GI bleed. No hepatitis. Genitourinary: No dysuria, hematuria or kidney stone. No incontinance. Neurological: No headache, stroke or seizures.  Psychiatry: No psych facility admission for anxiety, depression or suicide. No detox. Skin: No rash. Musculoskeletal: Positive joint pain. No fibromyalgia. No neck pain or back pain. Lymphadenopathy: No lymphadenopathy Hematology: No anemia or easy bruising.   Blood pressure (!) 112/52, pulse 74, temperature 98.4 F (36.9 C), temperature source Oral, resp. rate 18, height 5\' 2"  (1.575 m), weight 65.3 kg (143 lb 14.4 oz), SpO2 100 %. Body mass index is 26.32 kg/m. General appearance: alert, cooperative, appears stated age and no distress Head: Normocephalic, atraumatic. Eyes: Brown eyes, pink conjunctivae/corneas clear. PERRL, EOM'Gibson intact.  Neck: No adenopathy, no carotid bruit, no JVD, supple, symmetrical, trachea midline and thyroid not enlarged. Resp: clear to auscultation bilaterally Cardio: Regular rate and rhythm, S1, S2 normal, II/VI systolic murmur, no click, rub or gallop GI: soft, non-tender; bowel sounds  normal; no masses,  no organomegaly Extremities: extremities normal, atraumatic, no cyanosis or edema Skin: Warm and dry. No rashes or lesions Neurologic: Alert and oriented X 3, normal strength and tone. Normal coordination and gait.  Assessment/Plan Chest pain r/o CAD DM, II HTN Hyperlipidemia  Cardiac cath v/Gibson repeat nuclear stress test.  Kim RodriguezKADAKIA,Treasure Ochs S, MD  09/24/2016, 4:21 PM

## 2016-09-25 ENCOUNTER — Encounter (HOSPITAL_COMMUNITY): Payer: Self-pay

## 2016-09-25 DIAGNOSIS — I25110 Atherosclerotic heart disease of native coronary artery with unstable angina pectoris: Secondary | ICD-10-CM | POA: Diagnosis not present

## 2016-09-25 LAB — BASIC METABOLIC PANEL
Anion gap: 10 (ref 5–15)
BUN: 16 mg/dL (ref 6–20)
CALCIUM: 9.4 mg/dL (ref 8.9–10.3)
CO2: 22 mmol/L (ref 22–32)
CREATININE: 0.67 mg/dL (ref 0.44–1.00)
Chloride: 106 mmol/L (ref 101–111)
GFR calc Af Amer: >60 mL/min (ref >60)
GLUCOSE: 111 mg/dL — AB (ref 65–99)
POTASSIUM: 3.8 mmol/L (ref 3.5–5.1)
SODIUM: 138 mmol/L (ref 135–145)

## 2016-09-25 LAB — TROPONIN I: Troponin I: <0.03 ng/mL (ref <0.03)

## 2016-09-25 LAB — GLUCOSE, CAPILLARY
Glucose-Capillary: 169 mg/dL — ABNORMAL HIGH (ref 65–99)
Glucose-Capillary: 219 mg/dL — ABNORMAL HIGH (ref 65–99)

## 2016-09-25 LAB — LIPID PANEL
CHOL/HDL RATIO: 3.6 ratio
CHOLESTEROL: 217 mg/dL — AB (ref 0–200)
HDL: 61 mg/dL (ref >40)
LDL Cholesterol: 133 mg/dL — ABNORMAL HIGH (ref 0–99)
Triglycerides: 113 mg/dL (ref <150)
VLDL: 23 mg/dL (ref 0–40)

## 2016-09-25 LAB — HEPARIN LEVEL (UNFRACTIONATED): Heparin Unfractionated: 0.34 IU/mL (ref 0.30–0.70)

## 2016-09-25 MED ORDER — ISOSORBIDE MONONITRATE ER 30 MG PO TB24
30.0000 mg | ORAL_TABLET | Freq: Every day | ORAL | 3 refills | Status: DC
Start: 2016-09-25 — End: 2017-02-11

## 2016-09-25 MED ORDER — ATORVASTATIN CALCIUM 40 MG PO TABS: 40.0000 mg | ORAL_TABLET | Freq: Every day | ORAL | 3 refills | Status: DC

## 2016-09-25 MED ORDER — ISOSORBIDE MONONITRATE ER 30 MG PO TB24: 30.0000 mg | ORAL_TABLET | Freq: Every day | ORAL | Status: DC

## 2016-09-25 NOTE — Discharge Summary (Signed)
Physician Discharge Summary  Patient ID: Kim Gibson MRN: [id number] DOB/AGE: 67/02/1951 67 y.o.  Admit date: 09/24/2016 Discharge date: 09/25/2016  Admission Diagnoses: Chest pain DM, II Hypertension Hyperlipidemia  Discharge Diagnoses:  Principal Problem:   Angina at rest [facility](HCC) Active Problems:   Multi-vessel, native vessel CAD   Left ventricular hypertrophy.   Diabetes type 2, uncontrolled (HCC)   Heart palpitations   HTN (hypertension)   HLD (hyperlipidemia)     Discharged Condition: fair  Hospital Course: 65 year old female with type II DM, hypertension, hyperlipidemia, insomnia and arthritis had recurrent chest heaviness. Her Nuclear stress test in past had shown anterior wall hypoperfusion. She underwent cardiac catheterization that confirmed diffuse narrowing of distal LAD with mild disease of osteal LAD and diagonal II arteries. She was started on low dose Imdur and HCTZ was discontinued due to LVH and diabetes. Her Simvastatin was changed to Atorvastatin for elevated LDL level of 133 mg. She will see me in 1 week for follow up.  Consults: cardiology  Significant Diagnostic Studies: labs: Near normal CBC with mildly low Hgb of 11.8 g. Near normal BMET. Mildly elevated LDL.  Cardiac catheterization: Hyperdynamic LV systolic function with mild multivessel CAD and diffuse narrowing of distal 1/2 of LAD.  Treatments: cardiac meds: atenolol, aspirin, atorvastatin, Imdur and nifedipine.  Discharge Exam: Blood pressure (!) 148/81, pulse 73, temperature 98.3 F (36.8 C), temperature source Oral, resp. rate 16, height 5\' 2"  (1.575 m), weight 66 kg (145 lb 8 oz), SpO2 100 %. General appearance: alert, cooperative and appears stated age Head: Normocephalic, atraumatic. Eyes: Brown eyes, wears glasses, pink conjunctivae/corneas clear. PERRL, EOM's intact.  Neck: no adenopathy, no carotid bruit, no JVD, supple, symmetrical, trachea midline and thyroid not enlarged. Resp:  clear to auscultation bilaterally. Cardio: Regular rate and rhythm, S1, S2 normal, II/VI systolic murmur, no click, rub or gallop. GI: soft, non-tender; bowel sounds normal; no masses,  no organomegaly Extremities: No cyanosis or edema. No right groin hematoma. Skin: Warm and dry. No rashes or lesions Neurologic: Alert and oriented X 3, normal strength and tone. Normal coordination and gait.  Disposition: 01-Home or Self Care   Allergies as of 09/25/2016      Reactions   Tape Itching      Medication List    STOP taking these medications   hydrochlorothiazide 25 MG tablet Commonly known as:  HYDRODIURIL   simvastatin 40 MG tablet Commonly known as:  ZOCOR     TAKE these medications   aspirin EC 81 MG tablet Take 81 mg by mouth daily.   atenolol 50 MG tablet Commonly known as:  TENORMIN Take 50 mg by mouth daily.   atorvastatin 40 MG tablet Commonly known as:  LIPITOR Take 1 tablet (40 mg total) by mouth daily.   enalapril 10 MG tablet Commonly known as:  VASOTEC Take 10 mg by mouth 2 (two) times daily.   glipiZIDE 10 MG tablet Commonly known as:  GLUCOTROL Take 10 mg by mouth 2 (two) times daily before a meal.   isosorbide mononitrate 30 MG 24 hr tablet Commonly known as:  IMDUR Take 1 tablet (30 mg total) by mouth daily.   metFORMIN 500 MG tablet Commonly known as:  GLUCOPHAGE Take 1,000 mg by mouth 2 (two) times daily with a meal.   NIFEdipine 20 MG capsule Commonly known as:  PROCARDIA Take 20 mg by mouth 2 (two) times daily.   zolpidem 5 MG tablet Commonly known as:  AMBIEN Take 5  mg by mouth at bedtime as needed for sleep.        SignedOrpah Cobb: Lincoln Kleiner S 09/25/2016, 12:45 PM

## 2016-09-28 ENCOUNTER — Encounter (HOSPITAL_COMMUNITY): Payer: Self-pay | Admitting: Cardiovascular Disease

## 2017-02-10 ENCOUNTER — Emergency Department (HOSPITAL_COMMUNITY): Payer: Medicaid Other

## 2017-02-10 ENCOUNTER — Encounter (HOSPITAL_COMMUNITY): Payer: Self-pay | Admitting: Emergency Medicine

## 2017-02-10 ENCOUNTER — Observation Stay (HOSPITAL_COMMUNITY)
Admission: EM | Admit: 2017-02-10 | Discharge: 2017-02-11 | Disposition: A | Discharge status: Home / Self Care | Payer: Medicaid Other | Attending: Cardiovascular Disease | Admitting: Cardiovascular Disease

## 2017-02-10 DIAGNOSIS — I10 Essential (primary) hypertension: Secondary | ICD-10-CM | POA: Diagnosis not present

## 2017-02-10 DIAGNOSIS — Z79899 Other long term (current) drug therapy: Secondary | ICD-10-CM | POA: Diagnosis not present

## 2017-02-10 DIAGNOSIS — Z794 Long term (current) use of insulin: Secondary | ICD-10-CM | POA: Diagnosis not present

## 2017-02-10 DIAGNOSIS — R0602 Shortness of breath: Secondary | ICD-10-CM | POA: Diagnosis not present

## 2017-02-10 DIAGNOSIS — E119 Type 2 diabetes mellitus without complications: Secondary | ICD-10-CM | POA: Diagnosis not present

## 2017-02-10 DIAGNOSIS — I2510 Atherosclerotic heart disease of native coronary artery without angina pectoris: Secondary | ICD-10-CM | POA: Insufficient documentation

## 2017-02-10 DIAGNOSIS — Z7982 Long term (current) use of aspirin: Secondary | ICD-10-CM | POA: Insufficient documentation

## 2017-02-10 DIAGNOSIS — E785 Hyperlipidemia, unspecified: Secondary | ICD-10-CM | POA: Diagnosis not present

## 2017-02-10 DIAGNOSIS — I209 Angina pectoris, unspecified: Secondary | ICD-10-CM

## 2017-02-10 DIAGNOSIS — R079 Chest pain, unspecified: Secondary | ICD-10-CM | POA: Diagnosis present

## 2017-02-10 HISTORY — DX: Angina pectoris, unspecified: I20.9

## 2017-02-10 HISTORY — DX: Atherosclerotic heart disease of native coronary artery without angina pectoris: I25.10

## 2017-02-10 LAB — BASIC METABOLIC PANEL
ANION GAP: 10 (ref 5–15)
BUN: 13 mg/dL (ref 6–20)
CALCIUM: 9.3 mg/dL (ref 8.9–10.3)
CO2: 23 mmol/L (ref 22–32)
CREATININE: 0.67 mg/dL (ref 0.44–1.00)
Chloride: 102 mmol/L (ref 101–111)
GFR calc Af Amer: >60 mL/min (ref >60)
GLUCOSE: 306 mg/dL — AB (ref 65–99)
Potassium: 4 mmol/L (ref 3.5–5.1)
Sodium: 135 mmol/L (ref 135–145)

## 2017-02-10 LAB — CBC
HCT: 36.6 % (ref 36.0–46.0)
Hemoglobin: 12.1 g/dL (ref 12.0–15.0)
MCH: 28.7 pg (ref 26.0–34.0)
MCHC: 33.1 g/dL (ref 30.0–36.0)
MCV: 86.7 fL (ref 78.0–100.0)
PLATELETS: 187 10*3/uL (ref 150–400)
RBC: 4.22 MIL/uL (ref 3.87–5.11)
RDW: 14.7 % (ref 11.5–15.5)
WBC: 4.2 10*3/uL (ref 4.0–10.5)

## 2017-02-10 LAB — GLUCOSE, CAPILLARY
Glucose-Capillary: 67 mg/dL (ref 65–99)
Glucose-Capillary: 91 mg/dL (ref 65–99)
Glucose-Capillary: 189 mg/dL — ABNORMAL HIGH (ref 65–99)

## 2017-02-10 LAB — I-STAT TROPONIN, ED: TROPONIN I, POC: 0 ng/mL (ref 0.00–0.08)

## 2017-02-10 LAB — CBG MONITORING, ED: Glucose-Capillary: 110 mg/dL — ABNORMAL HIGH (ref 65–99)

## 2017-02-10 LAB — HEPARIN LEVEL (UNFRACTIONATED): HEPARIN UNFRACTIONATED: 0.27 [IU]/mL — AB (ref 0.30–0.70)

## 2017-02-10 MED ORDER — ASPIRIN 81 MG PO CHEW
324.0000 mg | CHEWABLE_TABLET | Freq: Once | ORAL | Status: AC
Start: 2017-02-10 — End: 2017-02-10
  Administered 2017-02-10: 324 mg via ORAL
  Filled 2017-02-10: qty 4

## 2017-02-10 MED ORDER — METFORMIN HCL 500 MG PO TABS
1000.0000 mg | ORAL_TABLET | Freq: Two times a day (BID) | ORAL | Status: DC
Start: 2017-02-10 — End: 2017-02-11
  Administered 2017-02-11: 1000 mg via ORAL
  Filled 2017-02-10: qty 2

## 2017-02-10 MED ORDER — INSULIN ASPART 100 UNIT/ML ~~LOC~~ SOLN
4.0000 [IU] | Freq: Three times a day (TID) | SUBCUTANEOUS | Status: DC
  Administered 2017-02-11 (×2): 4 [IU] via SUBCUTANEOUS

## 2017-02-10 MED ORDER — ASPIRIN 81 MG PO CHEW: 324.0000 mg | CHEWABLE_TABLET | ORAL | Status: AC

## 2017-02-10 MED ORDER — ATENOLOL 25 MG PO TABS
50.0000 mg | ORAL_TABLET | Freq: Every day | ORAL | Status: DC
  Administered 2017-02-11: 50 mg via ORAL
  Filled 2017-02-10: qty 2

## 2017-02-10 MED ORDER — ONDANSETRON HCL 4 MG/2ML IJ SOLN: 4.0000 mg | Freq: Four times a day (QID) | INTRAMUSCULAR | Status: DC | PRN

## 2017-02-10 MED ORDER — NIFEDIPINE ER OSMOTIC RELEASE 30 MG PO TB24
60.0000 mg | ORAL_TABLET | Freq: Every day | ORAL | Status: DC
Start: 2017-02-10 — End: 2017-02-11
  Administered 2017-02-11: 60 mg via ORAL
  Filled 2017-02-10: qty 1
  Filled 2017-02-10: qty 2

## 2017-02-10 MED ORDER — ASPIRIN 300 MG RE SUPP: 300.0000 mg | RECTAL | Status: AC

## 2017-02-10 MED ORDER — ACETAMINOPHEN 325 MG PO TABS
650.0000 mg | ORAL_TABLET | ORAL | Status: DC | PRN
  Filled 2017-02-10: qty 2

## 2017-02-10 MED ORDER — NITROGLYCERIN 0.4 MG SL SUBL: 0.4000 mg | SUBLINGUAL_TABLET | SUBLINGUAL | Status: DC | PRN

## 2017-02-10 MED ORDER — ASPIRIN EC 81 MG PO TBEC
81.0000 mg | DELAYED_RELEASE_TABLET | Freq: Every day | ORAL | Status: DC
  Administered 2017-02-11: 81 mg via ORAL
  Filled 2017-02-10: qty 1

## 2017-02-10 MED ORDER — GLIPIZIDE 10 MG PO TABS
10.0000 mg | ORAL_TABLET | Freq: Two times a day (BID) | ORAL | Status: DC
  Administered 2017-02-11: 10 mg via ORAL
  Filled 2017-02-10 (×2): qty 1

## 2017-02-10 MED ORDER — ATORVASTATIN CALCIUM 40 MG PO TABS
40.0000 mg | ORAL_TABLET | Freq: Every day | ORAL | Status: DC
  Administered 2017-02-11: 40 mg via ORAL
  Filled 2017-02-10 (×2): qty 1

## 2017-02-10 MED ORDER — HEPARIN BOLUS VIA INFUSION
3800.0000 [IU] | Freq: Once | INTRAVENOUS | Status: AC
  Administered 2017-02-10: 3800 [IU] via INTRAVENOUS
  Filled 2017-02-10: qty 3800

## 2017-02-10 MED ORDER — MORPHINE SULFATE (PF) 4 MG/ML IV SOLN
2.0000 mg | Freq: Once | INTRAVENOUS | Status: DC
  Filled 2017-02-10: qty 1

## 2017-02-10 MED ORDER — HEPARIN (PORCINE) IN NACL 100-0.45 UNIT/ML-% IJ SOLN
950.0000 [IU]/h | INTRAMUSCULAR | Status: DC
  Administered 2017-02-10: 850 [IU]/h via INTRAVENOUS
  Administered 2017-02-11: 950 [IU]/h via INTRAVENOUS
  Filled 2017-02-10 (×2): qty 250

## 2017-02-10 MED ORDER — ENALAPRIL MALEATE 10 MG PO TABS
10.0000 mg | ORAL_TABLET | Freq: Two times a day (BID) | ORAL | Status: DC
  Administered 2017-02-10 – 2017-02-11 (×2): 10 mg via ORAL
  Filled 2017-02-10 (×3): qty 1

## 2017-02-10 MED ORDER — INSULIN ASPART 100 UNIT/ML ~~LOC~~ SOLN
0.0000 [IU] | Freq: Three times a day (TID) | SUBCUTANEOUS | Status: DC
Start: 2017-02-10 — End: 2017-02-11
  Administered 2017-02-11: 5 [IU] via SUBCUTANEOUS

## 2017-02-10 NOTE — Progress Notes (Signed)
ANTICOAGULATION CONSULT NOTE - Initial Consult  Pharmacy Consult for heparin Indication: chest pain/ACS  Patient Measurements: Height: 5\' 2"  (157.5 cm) Weight: 143 lb (64.9 kg) IBW/kg (Calculated) : 50.1 Heparin Dosing Weight: 63.3 kg  Vital Signs: Temp: 99.1 F (37.3 C) (05/17 0824) Temp Source: Oral (05/17 0824) BP: 115/76 (05/17 1000) Pulse Rate: 71 (05/17 1000)  Labs:  Recent Labs  02/10/17 0846  HGB 12.1  HCT 36.6  PLT 187  CREATININE 0.67    Estimated Creatinine Clearance: 60.3 mL/min (by C-G formula based on SCr of 0.67 mg/dL).   Medical History: Past Medical History:  Diagnosis Date  . Arthritis   . Diabetes mellitus without complication (HCC)   . Hyperlipemia   . Hypertension   . Insomnia     Assessment: 66yo F presents with chest pain. Pharmacy consulted to dose heparin. No PTA anticoagulation, Hgb 12.1, Plt wnl, no bleeding noted  Goal of Therapy:  Heparin level 0.3-0.7 units/ml Monitor platelets by anticoagulation protocol: Yes   Plan:  Give 3800 units bolus x 1 Start heparin infusion at 850 units/hr Check anti-Xa level in 6 hours and daily while on heparin Continue to monitor H&H and platelets    Mackie Paienee Liticia Gasior, PharmD PGY1 Pharmacy Resident Rx ED [phone]#[id number] 02/10/2017 10:36 AM

## 2017-02-10 NOTE — ED Notes (Addendum)
Returned from  X-ray

## 2017-02-10 NOTE — ED Notes (Signed)
Patient transported to X-ray 

## 2017-02-10 NOTE — Progress Notes (Signed)
Patient arrived from ED and oriented to unit, patient guide book, plan of care, safety goal, white board, call bell/TV controller and room in general. Patient unhappy about not being able to eat. MD placed order for diet. Patient understands that she will be NPO after midnight but states she needs to eat every couple of hours until then or she will be sick.  Pt resting with call bell within reach.  Will continue to monitor. Thomas HoffBurton, Birdella Sippel McClintock, RN

## 2017-02-10 NOTE — ED Notes (Signed)
Pt transported to Xray. 

## 2017-02-10 NOTE — Progress Notes (Signed)
Increased heparin level per order but patient continues to occlude IV site.  Patient has been advised to call when IV pump is beeping and try to keep arm straight to maintain patency.  Patient having hard time with IV occluding. IV team consult placed to assess for alternate site. Patient is left handed and IV is in left AC. Pt resting with call bell within reach.  Will continue to monitor. Thomas HoffBurton, Olusegun Gerstenberger McClintock, RN

## 2017-02-10 NOTE — H&P (Signed)
Referring Physician:  [name] is an 66 y.o. female.                       Chief Complaint: Chest pain  HPI: 66 year old female with PMH of type II DM, hyperlipidemia, hypertension and arthritis has left sided chest pain, non-radiating and increased with activity x 3 days. No fever or cough.  Past Medical History:  Diagnosis Date  . Arthritis   . Diabetes mellitus without complication ([place])   . Hyperlipemia   . Hypertension   . Insomnia       Past Surgical History:  Procedure Laterality Date  . CARDIAC CATHETERIZATION N/A 09/24/2016   Procedure: Left Heart Cath and Coronary Angiography;  Surgeon: Dixie Dials, MD;  Location: [facility];  Service: Cardiovascular;  Laterality: N/A;  . CESAREAN SECTION    . CORONARY ANGIOGRAM  2005    Family History  Problem Relation Age of Onset  . Heart attack Mother 56  . Heart attack Father 63  . Heart disease Brother    Social History:  reports that she has never smoked. She has never used smokeless tobacco. She reports that she does not drink alcohol or use drugs.  Allergies:  Allergies  Allergen Reactions  . Tape Itching     (Not in a hospital admission)  Results for orders placed or performed during the hospital encounter of 02/10/17 (from the past 48 hour(s))  Basic metabolic panel     Status: Abnormal   Collection Time: 02/10/17  8:46 AM  Result Value Ref Range   Sodium 135 135 - 145 mmol/L   Potassium 4.0 3.5 - 5.1 mmol/L   Chloride 102 101 - 111 mmol/L   CO2 23 22 - 32 mmol/L   Glucose, Bld 306 (H) 65 - 99 mg/dL   BUN 13 6 - 20 mg/dL   Creatinine, Ser 0.67 0.44 - 1.00 mg/dL   Calcium 9.3 8.9 - 10.3 mg/dL   GFR calc non Af Amer >60 >60 mL/min   GFR calc Af Amer >60 >60 mL/min    Comment: (NOTE) The eGFR has been calculated using the CKD EPI equation. This calculation has not been validated in all clinical situations. eGFR's persistently <60 mL/min signify possible Chronic Kidney Disease.    Anion  gap 10 5 - 15  CBC     Status: None   Collection Time: 02/10/17  8:46 AM  Result Value Ref Range   WBC 4.2 4.0 - 10.5 K/uL   RBC 4.22 3.87 - 5.11 MIL/uL   Hemoglobin 12.1 12.0 - 15.0 g/dL   HCT 36.6 36.0 - 46.0 %   MCV 86.7 78.0 - 100.0 fL   MCH 28.7 26.0 - 34.0 pg   MCHC 33.1 30.0 - 36.0 g/dL   RDW 14.7 11.5 - 15.5 %   Platelets 187 150 - 400 K/uL  I-stat troponin, ED     Status: None   Collection Time: 02/10/17  9:09 AM  Result Value Ref Range   Troponin i, poc 0.00 0.00 - 0.08 ng/mL   Comment 3            Comment: Due to the release kinetics of cTnI, a negative result within the first hours of the onset of symptoms does not rule out myocardial infarction with certainty. If myocardial infarction is still suspected, repeat the test at appropriate intervals.    Dg Chest 2 View  Result Date: 02/10/2017 CLINICAL DATA:  Left-sided  chest pain for several days EXAM: CHEST  2 VIEW COMPARISON:  07/30/2015 FINDINGS: The heart size and mediastinal contours are within normal limits. Both lungs are clear. The visualized skeletal structures are unremarkable. IMPRESSION: No active cardiopulmonary disease. Electronically Signed   By: Inez Catalina M.D.   On: 02/10/2017 09:19    Review Of Systems Constitutional: No fever, chills, weight loss or gain. Eyes: No vision change, wears glasses. No discharge or pain. Ears: No hearing loss, No tinnitus. Respiratory: No asthma, COPD, pneumonias. Positive shortness of breath. No hemoptysis. Cardiovascular: Positive chest pain, palpitation, no leg edema. Gastrointestinal: No nausea, vomiting, diarrhea, constipation. No GI bleed. No hepatitis. Genitourinary: No dysuria, hematuria, kidney stone. No incontinance. Neurological: No headache, stroke, seizures.  Psychiatry: No psych facility admission for anxiety, depression, suicide. No detox. Skin: No rash. Musculoskeletal: Positive joint pain, no fibromyalgia. No neck pain, back pain. Lymphadenopathy: No  lymphadenopathy. Hematology: No anemia or easy bruising.   Blood pressure 115/76, pulse 71, temperature 99.1 F (37.3 C), temperature source Oral, resp. rate (!) 30, SpO2 97 %. There is no height or weight on file to calculate BMI. General appearance: alert, cooperative, appears stated age and no distress Head: Normocephalic, atraumatic. Eyes: Brown eyes, pink conjunctiva, corneas clear. PERRL, EOM's intact. Neck: No adenopathy, no carotid bruit, no JVD, supple, symmetrical, trachea midline and thyroid not enlarged. Resp: Clear to auscultation bilaterally. Cardio: Regular rate and rhythm, S1, S2 normal, II/VI systolic murmur, no click, rub or gallop GI: Soft, non-tender; bowel sounds normal; no organomegaly. Extremities: No edema, cyanosis or clubbing. Skin: Warm and dry.  Neurologic: Alert and oriented X 3, normal strength. Normal coordination.  Assessment/Plan Chest pain CAD Type II DM Hypertension Hyperlipidemia  Nuclear stress test in AM. Home medications.  Birdie Riddle, MD  02/10/2017, 10:25 AM

## 2017-02-10 NOTE — Progress Notes (Signed)
ANTICOAGULATION CONSULT NOTE - Initial Consult  Pharmacy Consult for heparin Indication: chest pain/ACS  Patient Measurements: Height: 5\' 2"  (157.5 cm) Weight: 154 lb 14.4 oz (70.3 kg) IBW/kg (Calculated) : 50.1 Heparin Dosing Weight: 63.3 kg  Vital Signs: Temp: 98.1 F (36.7 C) (05/17 1547) Temp Source: Oral (05/17 1547) BP: 138/81 (05/17 1547) Pulse Rate: 72 (05/17 1547)  Assessment: 65yo F presents with chest pain. Pharmacy consulted to dose heparin. No PTA anticoagulation, Hgb 12.1, Plt wnl, no bleeding noted. First heparin level came back borderline low at 0.27 although drawn 5 hrs after heparin start.  Goal of Therapy:  Heparin level 0.3-0.7 units/ml Monitor platelets by anticoagulation protocol: Yes   Plan:  Increase heparin gtt slightly to 950 units/hr Check 6 hr heparin level Monitor daily heparin level, CBC, s/s of bleed  Enzo BiNathan Mollee Neer, PharmD, [facility] HospitalBCPS Clinical Pharmacist Pager [phone] 02/10/2017 4:55 PM

## 2017-02-10 NOTE — ED Provider Notes (Signed)
[facility]-EMERGENCY DEPT Provider Note   CSN: [id number] Arrival date & time: 02/10/17  4580     History   Chief Complaint Chief Complaint  Patient presents with  . Chest Pain    HPI Kim Gibson is a 66 y.o. female.  66yo F w/ PMH including CAD, HTN, HLD, T2DM who p/w chest pain. The patient reports 3 days of intermittent left-sided chest heaviness that radiates to her left shoulder and down her left arm. It is occasionally associated with shortness of breath and is worse with exertion. She states she has had this chest pain previously, for which she had cardiac cath in Dec 2017. She has intermittently had the same pain since then but it has been worse over the past 3 days. She states that she was previously given isosorbide but was unable to tolerate this medication. She has been compliant with all of her other medications. She reports intermittent dry cough at night but denies any fevers or recent illness. No recent travel.   The history is provided by the patient.  Chest Pain      Past Medical History:  Diagnosis Date  . Arthritis   . Diabetes mellitus without complication (HCC)   . Hyperlipemia   . Hypertension   . Insomnia     Patient Active Problem List   Diagnosis Date Noted  . Precordial chest pain 09/24/2016  . Angina at rest [facility]) 07/30/2015  . Dysphagia 07/30/2015  . Heart palpitations 07/30/2015  . Diabetes type 2, uncontrolled (HCC) 07/30/2015  . HTN (hypertension) 07/30/2015  . HLD (hyperlipidemia) 07/30/2015  . Diabetes mellitus with complication [facility])     Past Surgical History:  Procedure Laterality Date  . CARDIAC CATHETERIZATION N/A 09/24/2016   Procedure: Left Heart Cath and Coronary Angiography;  Surgeon: Orpah Cobb, MD;  Location: [facility];  Service: Cardiovascular;  Laterality: N/A;  . CESAREAN SECTION    . CORONARY ANGIOGRAM  2005    OB History    No data available       Home Medications    Prior to Admission  medications   Medication Sig Start Date End Date Taking? Authorizing Provider  aspirin EC 81 MG tablet Take 81 mg by mouth daily.    [provider]  atenolol (TENORMIN) 50 MG tablet Take 50 mg by mouth daily.    [provider]  atorvastatin (LIPITOR) 40 MG tablet Take 1 tablet (40 mg total) by mouth daily. 09/25/16   Orpah Cobb, MD  enalapril (VASOTEC) 10 MG tablet Take 10 mg by mouth 2 (two) times daily.    [provider]  glipiZIDE (GLUCOTROL) 10 MG tablet Take 10 mg by mouth 2 (two) times daily before a meal.    [provider]  isosorbide mononitrate (IMDUR) 30 MG 24 hr tablet Take 1 tablet (30 mg total) by mouth daily. 09/25/16   Orpah Cobb, MD  metFORMIN (GLUCOPHAGE) 500 MG tablet Take 1,000 mg by mouth 2 (two) times daily with a meal.    [provider]  NIFEdipine (PROCARDIA) 20 MG capsule Take 20 mg by mouth 2 (two) times daily.    [provider]  zolpidem (AMBIEN) 5 MG tablet Take 5 mg by mouth at bedtime as needed for sleep.    [provider]    Family History Family History  Problem Relation Age of Onset  . Heart attack Mother 105  . Heart attack Father 34  . Heart disease Brother     Social  History Social History  Substance Use Topics  . Smoking status: Never Smoker  . Smokeless tobacco: Never Used  . Alcohol use No     Allergies   Tape   Review of Systems Review of Systems  Cardiovascular: Positive for chest pain.   All other systems reviewed and are negative except that which was mentioned in HPI  Physical Exam Updated Vital Signs BP 109/65 (BP Location: Right Arm)   Pulse 75   Temp 99.1 F (37.3 C) (Oral)   Resp 16   SpO2 98%   Physical Exam  Constitutional: She is oriented to person, place, and time. She appears well-developed and well-nourished. No distress.  HENT:  Head: Normocephalic and atraumatic.  Moist mucous membranes  Eyes: Conjunctivae are normal. Pupils are equal,  round, and reactive to light.  Neck: Neck supple.  Cardiovascular: Normal rate and regular rhythm.   Murmur heard.  Systolic murmur is present with a grade of 1/6  Pulmonary/Chest: Effort normal and breath sounds normal.  Abdominal: Soft. Bowel sounds are normal. She exhibits no distension. There is no tenderness.  Musculoskeletal: She exhibits no edema.  Neurological: She is alert and oriented to person, place, and time.  Fluent speech  Skin: Skin is warm and dry.  Psychiatric: She has a normal mood and affect. Judgment normal.  Nursing note and vitals reviewed.    ED Treatments / Results  Labs (all labs ordered are listed, but only abnormal results are displayed) Labs Reviewed  BASIC METABOLIC PANEL - Abnormal; Notable for the following:       Result Value   Glucose, Bld 306 (*)    All other components within normal limits  CBC  HEPARIN LEVEL (UNFRACTIONATED)  HEMOGLOBIN A1C  I-STAT TROPOININ, ED    EKG  EKG Interpretation None       Radiology Dg Chest 2 View  Result Date: 02/10/2017 CLINICAL DATA:  Left-sided chest pain for several days EXAM: CHEST  2 VIEW COMPARISON:  07/30/2015 FINDINGS: The heart size and mediastinal contours are within normal limits. Both lungs are clear. The visualized skeletal structures are unremarkable. IMPRESSION: No active cardiopulmonary disease. Electronically Signed   By: Alcide Clever M.D.   On: 02/10/2017 09:19    Procedures Procedures (including critical care time)  Medications Ordered in ED Medications  aspirin chewable tablet 324 mg (324 mg Oral Given 02/10/17 0904)     Initial Impression / Assessment and Plan / ED Course  I have reviewed the triage vital signs and the nursing notes.  Pertinent labs & imaging results that were available during my care of the patient were reviewed by me and considered in my medical decision making (see chart for details).     Pt w/ CAD on cath from 5 months ago who p/w intermittent  left-sided chest heaviness worse over the past 3 days. She reports that in the ED her pain has been minimal. On exam she was well-appearing with normal vital signs. No acute ischemic changes on EKG. Gave the patient aspirin and obtained above labs.  Initial troponin negative, reassuring CBC and BMP. Chest x-ray negative acute. I discussed with the patient's cardiologist, Dr. Algie Coffer, who agreed that her sx are concerning for ACS given her h/o CAD. He will admit the patient for further workup.  Final Clinical Impressions(s) / ED Diagnoses   Final diagnoses:  Chest pain, unspecified type    New Prescriptions New Prescriptions   No medications on file     Ande Therrell, Ambrose Finland,  MD 02/10/17 1307

## 2017-02-10 NOTE — ED Triage Notes (Signed)
Pt arrives via POv from home with left sided chest pain and heaviness for the last 3 days. Pt reports radfiation to left arm. Hx diabetes, HTN. Reports some SOB, cough, denies fever.

## 2017-02-11 ENCOUNTER — Observation Stay (HOSPITAL_COMMUNITY): Payer: Medicaid Other

## 2017-02-11 DIAGNOSIS — R0602 Shortness of breath: Secondary | ICD-10-CM | POA: Diagnosis not present

## 2017-02-11 DIAGNOSIS — I2510 Atherosclerotic heart disease of native coronary artery without angina pectoris: Secondary | ICD-10-CM | POA: Diagnosis not present

## 2017-02-11 DIAGNOSIS — R079 Chest pain, unspecified: Secondary | ICD-10-CM | POA: Diagnosis not present

## 2017-02-11 DIAGNOSIS — E785 Hyperlipidemia, unspecified: Secondary | ICD-10-CM | POA: Diagnosis not present

## 2017-02-11 LAB — LIPID PANEL
CHOLESTEROL: 231 mg/dL — AB (ref 0–200)
HDL: 75 mg/dL (ref >40)
LDL Cholesterol: 136 mg/dL — ABNORMAL HIGH (ref 0–99)
TRIGLYCERIDES: 98 mg/dL (ref <150)
Total CHOL/HDL Ratio: 3.1 RATIO
VLDL: 20 mg/dL (ref 0–40)

## 2017-02-11 LAB — HEPARIN LEVEL (UNFRACTIONATED)
Heparin Unfractionated: 0.55 IU/mL (ref 0.30–0.70)
Heparin Unfractionated: 1.03 IU/mL — ABNORMAL HIGH (ref 0.30–0.70)

## 2017-02-11 LAB — BASIC METABOLIC PANEL
Anion gap: 10 (ref 5–15)
BUN: 8 mg/dL (ref 6–20)
CHLORIDE: 104 mmol/L (ref 101–111)
CO2: 24 mmol/L (ref 22–32)
CREATININE: 0.58 mg/dL (ref 0.44–1.00)
Calcium: 9.4 mg/dL (ref 8.9–10.3)
Glucose, Bld: 122 mg/dL — ABNORMAL HIGH (ref 65–99)
POTASSIUM: 3.5 mmol/L (ref 3.5–5.1)
SODIUM: 138 mmol/L (ref 135–145)

## 2017-02-11 LAB — CBC
HEMATOCRIT: 38.6 % (ref 36.0–46.0)
HEMOGLOBIN: 12.9 g/dL (ref 12.0–15.0)
MCH: 28.8 pg (ref 26.0–34.0)
MCHC: 33.4 g/dL (ref 30.0–36.0)
MCV: 86.2 fL (ref 78.0–100.0)
Platelets: 193 10*3/uL (ref 150–400)
RBC: 4.48 MIL/uL (ref 3.87–5.11)
RDW: 14.6 % (ref 11.5–15.5)
WBC: 4.5 10*3/uL (ref 4.0–10.5)

## 2017-02-11 LAB — PROTIME-INR
INR: 0.97
Prothrombin Time: 12.9 seconds (ref 11.4–15.2)

## 2017-02-11 LAB — TROPONIN I

## 2017-02-11 LAB — GLUCOSE, CAPILLARY
GLUCOSE-CAPILLARY: 160 mg/dL — AB (ref 65–99)
GLUCOSE-CAPILLARY: 226 mg/dL — AB (ref 65–99)

## 2017-02-11 MED ORDER — REGADENOSON 0.4 MG/5ML IV SOLN
0.4000 mg | Freq: Once | INTRAVENOUS | Status: AC
  Administered 2017-02-11: 0.4 mg via INTRAVENOUS
  Filled 2017-02-11: qty 5

## 2017-02-11 MED ORDER — REGADENOSON 0.4 MG/5ML IV SOLN
INTRAVENOUS | Status: AC
  Filled 2017-02-11: qty 5

## 2017-02-11 MED ORDER — TECHNETIUM TC 99M TETROFOSMIN IV KIT
30.0000 | PACK | Freq: Once | INTRAVENOUS | Status: AC | PRN
  Administered 2017-02-11: 30 via INTRAVENOUS

## 2017-02-11 MED ORDER — NITROGLYCERIN 0.4 MG SL SUBL: 0.4000 mg | SUBLINGUAL_TABLET | SUBLINGUAL | 1 refills | Status: DC | PRN

## 2017-02-11 MED ORDER — NIFEDIPINE ER 60 MG PO TB24: 60.0000 mg | ORAL_TABLET | Freq: Every day | ORAL | Status: DC

## 2017-02-11 MED ORDER — RANOLAZINE ER 500 MG PO TB12
500.0000 mg | ORAL_TABLET | Freq: Two times a day (BID) | ORAL | Status: DC
  Administered 2017-02-11: 500 mg via ORAL
  Filled 2017-02-11: qty 1

## 2017-02-11 MED ORDER — TECHNETIUM TC 99M TETROFOSMIN IV KIT
10.0000 | PACK | Freq: Once | INTRAVENOUS | Status: AC
  Administered 2017-02-11: 10 via INTRAVENOUS

## 2017-02-11 MED ORDER — AMINOPHYLLINE 25 MG/ML IV SOLN
INTRAVENOUS | Status: AC
  Filled 2017-02-11: qty 10

## 2017-02-11 MED ORDER — RANOLAZINE ER 500 MG PO TB12: 500.0000 mg | ORAL_TABLET | Freq: Two times a day (BID) | ORAL | 3 refills | Status: AC

## 2017-02-11 NOTE — Discharge Summary (Signed)
Physician Discharge Summary  Patient ID: Kim Gibson MRN: [id number] DOB/AGE: 68/02/1951 68 y.o.  Admit date: 02/10/2017 Discharge date: 02/11/2017  Admission Diagnoses: Chest pain CAD Type II DM Hypertension Hyperlipidemia  Discharge Diagnoses:  Principle diagnosis: * Chest pain * Active Problems:   CAD   Type II DM   Hypertension   Hyperlipidemia  Discharged Condition: fair  Hospital Course: 66 yrs old female has recurrent left sided chest pain increased with activity. Her Troponin was negative. CBC and BMET were near normal. Troponin-I was normal. She underwent nuclear stress test that was unremarkable. She can not tolerate low dose of long acting NTG, hence Ranolazine was started. She will continue her antihypertensive medications and atorvastatin. She will she me in 1 week and primary care in 2 weeks.  Consults: cardiology  Significant Diagnostic Studies: labs: CBC and BMET are near normal except elevated blood sugar level. Troponin-I was also normal.   CXR: Normal  EKG: Normal.  Nuclear stress test normal wirth breast attenuation of anterior wall.  Treatments: cardiac meds: enalapril (Vasotec), atenolol, Nifedipine long acting, ranolazine, aspirin and atorvastatin.  Discharge Exam: Blood pressure (!) 184/88, pulse 75, temperature 97.8 F (36.6 C), temperature source Oral, resp. rate 20, height 5\' 2"  (1.575 m), weight 62.1 kg (136 lb 12.8 oz), SpO2 100 %. General appearance: alert, cooperative and appears stated age. Head: Normocephalic, atraumatic. Eyes: Brown, pink conjunctiva, corneas clear. PERRL, EOM's intact.  Neck: No adenopathy, no carotid bruit, no JVD, supple, symmetrical, trachea midline and thyroid not enlarged. Resp: Clear to auscultation bilaterally. Cardio: Regular rate and rhythm, S1, S2 normal, II/VI systolic murmur, no click, rub or gallop. GI: Soft, non-tender; bowel sounds normal; no organomegaly. Extremities: No edema, cyanosis or  clubbing. Skin: Warm and dry.  Neurologic: Alert and oriented X 3, normal strength and tone. Normal coordination and gait.  Disposition: 01-Home or Self Care   Allergies as of 02/11/2017      Reactions   Tape Itching      Medication List    TAKE these medications   aspirin EC 81 MG tablet Take 81 mg by mouth daily.   atenolol 50 MG tablet Commonly known as:  TENORMIN Take 50 mg by mouth daily.   atorvastatin 40 MG tablet Commonly known as:  LIPITOR Take 1 tablet (40 mg total) by mouth daily.   enalapril 10 MG tablet Commonly known as:  VASOTEC Take 10 mg by mouth 2 (two) times daily.   glipiZIDE 10 MG tablet Commonly known as:  GLUCOTROL Take 10 mg by mouth 2 (two) times daily before a meal.   insulin glargine 100 UNIT/ML injection Commonly known as:  LANTUS Inject 12 Units into the skin at bedtime.   metFORMIN 500 MG tablet Commonly known as:  GLUCOPHAGE Take 1,000 mg by mouth 2 (two) times daily with a meal.   NIFEdipine 60 MG 24 hr tablet Commonly known as:  PROCARDIA-XL/ADALAT CC Take 1 tablet (60 mg total) by mouth daily. Start taking on:  02/12/2017   nitroGLYCERIN 0.4 MG SL tablet Commonly known as:  NITROSTAT Place 1 tablet (0.4 mg total) under the tongue every 5 (five) minutes x 3 doses as needed for chest pain.   OMEGA-3-6-9 PO Take 1 capsule by mouth daily.   ranolazine 500 MG 12 hr tablet Commonly known as:  RANEXA Take 1 tablet (500 mg total) by mouth 2 (two) times daily.   vitamin E 400 UNIT capsule Take 400 Units by mouth daily.  Follow-up Information    Valerie Roys, FNP. Schedule an appointment as soon as possible for a visit in 2 week(s).   Specialty:  Nurse Practitioner Contact information: [address] [place] [address] [phone]        Orpah Cobb, MD. Schedule an appointment as soon as possible for a visit in 1 week(s).   Specialty:  Cardiology Contact information: [address] [place] [address] [phone]           Signed: Ricki Rodriguez 02/11/2017, 4:57 PM

## 2017-02-11 NOTE — Progress Notes (Signed)
RN notified Dr Algie CofferKadakia of patient's elevated blood pressure after she received her daily meds after stress test. He stated OK and stated to "monitor the blood pressure." RN notified by pharmacist that patient's heparin level elevated. RN notified Dr Algie Cofferkadakia of this and he stated that that heparin infusion could be discontinued and that he would check on patient again this afternoon.

## 2017-02-11 NOTE — Progress Notes (Signed)
Pt complained of intermitted chest pain that rated a 6/10. Pt refused Nitro, said "the headache I get from nitro is worse than this chest pain". Called the md for further instructions. Vital signs stable. EKG in chart. Later presented pt with pain medication (morphine) and she said that pain had completely gone away and that she didn't want the pain medication. Wasted the medication with Cathlean MarseillesKari RN. Will continue to follow.

## 2017-02-11 NOTE — Progress Notes (Signed)
Discharge order received. Dr Algie Cofferkadakia came to patient's room to go over her stress test results with the patient. RN provided patient with discharge paperwork and educated patient on discharge instructions, medications, and need for patient to make follow up appointments. Patient verbalized understanding. Iv removed. Patient stated she had notified her son that she was being discharged and he did not get off work until after 7 PM.

## 2017-02-11 NOTE — Progress Notes (Signed)
Pt's son arrived to pick her up. Wheeled out by nurse tech with belongings in her lap.

## 2017-02-11 NOTE — Progress Notes (Signed)
ANTICOAGULATION CONSULT NOTE - Follow-up Consult  Pharmacy Consult for heparin Indication: chest pain/ACS  Patient Measurements: Height: 5\' 2"  (157.5 cm) Weight: 154 lb 14.4 oz (70.3 kg) IBW/kg (Calculated) : 50.1 Heparin Dosing Weight: 63.3 kg  Vital Signs: Temp: 98.3 F (36.8 C) (05/17 2034) Temp Source: Oral (05/17 2034) BP: 153/69 (05/17 2034) Pulse Rate: 71 (05/17 2034)  Labs:  Recent Labs  02/10/17 0846 02/10/17 1553 02/11/17 0017  HGB 12.1  --   --   HCT 36.6  --   --   PLT 187  --   --   HEPARINUNFRC  --  0.27* 0.55  CREATININE 0.67  --   --     Estimated Creatinine Clearance: 62.7 mL/min (by C-G formula based on SCr of 0.67 mg/dL).  Assessment: 65yo F on heparin for r/o ACS. Heparin level now therapeutic (0.55) on gtt at 950 units/hr. No bleeding noted.  Goal of Therapy:  Heparin level 0.3-0.7 units/ml Monitor platelets by anticoagulation protocol: Yes   Plan:  Will continue heparin 950 units/hr Will f/u 6hr confirmatory heparin level  Christoper Fabianaron Slayton Lubitz, PharmD, BCPS Clinical pharmacist, pager [phone] 02/11/2017 1:18 AM

## 2017-02-11 NOTE — Progress Notes (Signed)
Results for Kim Gibson, Kim Gibson (MRN [id number]) as of 02/11/2017 10:14  Ref. Range 02/10/2017 13:16 02/10/2017 16:03 02/10/2017 16:54 02/10/2017 21:31 02/11/2017 06:23  Glucose-Capillary Latest Ref Range: 65 - 99 mg/dL 829110 (H) 67 91 562189 (H) 130160 (H)  Noted that CBGs have been less than 70 mg/dl.  Recommend decreasing Novolog correction scale to SENSITIVE TID. May need to decrease Novolog meal coverage if blood sugars continue to be less than 100 mg/dl.  Will continue to monitor blood sugars while in the hospital.   Smith MinceKendra Roniya Tetro RN BSN CDE Diabetes Coordinator Pager: [phone]  8am-5pm

## 2017-02-11 NOTE — Progress Notes (Signed)
Patient stated that the pharmacy that her prescriptions had been sent to were not the pharmacy that she picked up her medications from. Patient [facility] RN [facility] [address]. [facility] and informed them that the patient's [facility] [address]. [facility] [address] stated that they would transfer patient's medications electronically so that [facility] [address] [facility].

## 2017-02-12 LAB — HEMOGLOBIN A1C
HEMOGLOBIN A1C: 7.3 % — AB (ref 4.8–5.6)
Mean Plasma Glucose: 163 mg/dL

## 2017-04-04 ENCOUNTER — Encounter: Payer: Self-pay | Admitting: Nurse Practitioner

## 2018-02-20 ENCOUNTER — Other Ambulatory Visit: Payer: Self-pay

## 2018-02-20 ENCOUNTER — Emergency Department (HOSPITAL_COMMUNITY): Payer: Medicaid Other

## 2018-02-20 ENCOUNTER — Encounter (HOSPITAL_COMMUNITY): Payer: Self-pay | Admitting: Emergency Medicine

## 2018-02-20 ENCOUNTER — Emergency Department (HOSPITAL_COMMUNITY)
Admission: EM | Admit: 2018-02-20 | Discharge: 2018-02-20 | Disposition: A | Discharge status: Home / Self Care | Payer: Medicaid Other | Attending: Emergency Medicine | Admitting: Emergency Medicine

## 2018-02-20 DIAGNOSIS — Z7984 Long term (current) use of oral hypoglycemic drugs: Secondary | ICD-10-CM | POA: Diagnosis not present

## 2018-02-20 DIAGNOSIS — I2510 Atherosclerotic heart disease of native coronary artery without angina pectoris: Secondary | ICD-10-CM | POA: Diagnosis not present

## 2018-02-20 DIAGNOSIS — Z79899 Other long term (current) drug therapy: Secondary | ICD-10-CM | POA: Insufficient documentation

## 2018-02-20 DIAGNOSIS — E119 Type 2 diabetes mellitus without complications: Secondary | ICD-10-CM | POA: Diagnosis not present

## 2018-02-20 DIAGNOSIS — Z7982 Long term (current) use of aspirin: Secondary | ICD-10-CM | POA: Diagnosis not present

## 2018-02-20 DIAGNOSIS — I10 Essential (primary) hypertension: Secondary | ICD-10-CM | POA: Insufficient documentation

## 2018-02-20 DIAGNOSIS — R109 Unspecified abdominal pain: Secondary | ICD-10-CM | POA: Diagnosis not present

## 2018-02-20 LAB — BASIC METABOLIC PANEL
Anion gap: 8 (ref 5–15)
BUN: 15 mg/dL (ref 6–20)
CALCIUM: 9.5 mg/dL (ref 8.9–10.3)
CO2: 25 mmol/L (ref 22–32)
CREATININE: 1.16 mg/dL — AB (ref 0.44–1.00)
Chloride: 104 mmol/L (ref 101–111)
GFR calc Af Amer: 55 mL/min — ABNORMAL LOW (ref >60)
GFR, EST NON AFRICAN AMERICAN: 47 mL/min — AB (ref >60)
Glucose, Bld: 221 mg/dL — ABNORMAL HIGH (ref 65–99)
Potassium: 4.7 mmol/L (ref 3.5–5.1)
SODIUM: 137 mmol/L (ref 135–145)

## 2018-02-20 LAB — URINALYSIS, ROUTINE W REFLEX MICROSCOPIC
BACTERIA UA: NONE SEEN
BILIRUBIN URINE: NEGATIVE
Glucose, UA: NEGATIVE mg/dL
Hgb urine dipstick: NEGATIVE
KETONES UR: NEGATIVE mg/dL
Nitrite: NEGATIVE
PH: 5 (ref 5.0–8.0)
PROTEIN: 100 mg/dL — AB
Specific Gravity, Urine: 1.023 (ref 1.005–1.030)

## 2018-02-20 LAB — CBC
HCT: 36.1 % (ref 36.0–46.0)
Hemoglobin: 11.8 g/dL — ABNORMAL LOW (ref 12.0–15.0)
MCH: 28.6 pg (ref 26.0–34.0)
MCHC: 32.7 g/dL (ref 30.0–36.0)
MCV: 87.6 fL (ref 78.0–100.0)
PLATELETS: 219 10*3/uL (ref 150–400)
RBC: 4.12 MIL/uL (ref 3.87–5.11)
RDW: 13.5 % (ref 11.5–15.5)
WBC: 5.3 10*3/uL (ref 4.0–10.5)

## 2018-02-20 LAB — HEPATIC FUNCTION PANEL
ALT: 34 U/L (ref 14–54)
AST: 23 U/L (ref 15–41)
Albumin: 3.8 g/dL (ref 3.5–5.0)
Alkaline Phosphatase: 45 U/L (ref 38–126)
BILIRUBIN TOTAL: 0.5 mg/dL (ref 0.3–1.2)
Bilirubin, Direct: <0.1 mg/dL — ABNORMAL LOW (ref 0.1–0.5)
Total Protein: 7 g/dL (ref 6.5–8.1)

## 2018-02-20 LAB — LIPASE, BLOOD: LIPASE: 35 U/L (ref 11–51)

## 2018-02-20 MED ORDER — METHOCARBAMOL 500 MG PO TABS: 500.0000 mg | ORAL_TABLET | Freq: Two times a day (BID) | ORAL | 0 refills | Status: DC

## 2018-02-20 NOTE — ED Triage Notes (Signed)
Pt states she had a UTI recently and was treated with antibiotics. Pt has since developed right sided flank pain. Denies any urinary symptoms at this time

## 2018-02-20 NOTE — ED Provider Notes (Signed)
[facility] EMERGENCY DEPARTMENT Provider Note   CSN: [id number] Arrival date & time: 02/20/18  0940     History   Chief Complaint Chief Complaint  Patient presents with  . Flank Pain    HPI Kim Gibson is a 67 y.o. female.  HPI Kim Gibson is a 67 y.o. female with history of coronary disease, diabetes, hypertension, hyperlipidemia, presents to emergency department complaining of right flank pain.  Patient states she has had had urinary flank pain for approximately 2 weeks now.  She states initially frequency and went to her PCP and was placed on antibiotic for 5 days.  She states she was diagnosed with cystitis.  She states that after she finished antibiotics, she continued to have pain in the flank and was placed on 10 more days of antibiotic which she finished 2 days ago.  She states that since then the pain just worsened.  The pain is in the right flank, comes and goes in severity, constant mild pain at all times.  Radiates to the left side as well.  Pain is worsened with movement.  Denies any pain radiating down her extremities.  Denies abdominal pain.  No nausea or vomiting.  No fever or chills.  She does report to urinary frequency, no urgency or dysuria.  No vaginal complaints at this time.  No medications taken for pain prior to coming in.  Past Medical History:  Diagnosis Date  . Anginal pain (HCC) 02/10/2017   AT REST  . Arthritis   . Coronary artery disease   . Diabetes mellitus without complication (HCC)   . Hyperlipemia   . Hypertension   . Insomnia     Patient Active Problem List   Diagnosis Date Noted  . Chest pain 02/10/2017  . Precordial chest pain 09/24/2016  . Angina at rest [facility]) 07/30/2015  . Dysphagia 07/30/2015  . Heart palpitations 07/30/2015  . Diabetes type 2, uncontrolled (HCC) 07/30/2015  . HTN (hypertension) 07/30/2015  . HLD (hyperlipidemia) 07/30/2015  . Diabetes mellitus with complication [facility])     Past  Surgical History:  Procedure Laterality Date  . CARDIAC CATHETERIZATION N/A 09/24/2016   Procedure: Left Heart Cath and Coronary Angiography;  Surgeon: Orpah Cobb, MD;  Location: [facility] INVASIVE CV LAB;  Service: Cardiovascular;  Laterality: N/A;  . CESAREAN SECTION    . CORONARY ANGIOGRAM  2005     OB History   None      Home Medications    Prior to Admission medications   Medication Sig Start Date End Date Taking? Authorizing Provider  aspirin EC 81 MG tablet Take 81 mg by mouth daily.    [provider]  atenolol (TENORMIN) 50 MG tablet Take 50 mg by mouth daily.    [provider]  atorvastatin (LIPITOR) 40 MG tablet Take 1 tablet (40 mg total) by mouth daily. 09/25/16   Orpah Cobb, MD  enalapril (VASOTEC) 10 MG tablet Take 10 mg by mouth 2 (two) times daily.    [provider]  glipiZIDE (GLUCOTROL) 10 MG tablet Take 10 mg by mouth 2 (two) times daily before a meal.    [provider]  insulin glargine (LANTUS) 100 UNIT/ML injection Inject 12 Units into the skin at bedtime.    [provider]  metFORMIN (GLUCOPHAGE) 500 MG tablet Take 1,000 mg by mouth 2 (two) times daily with a meal.    [provider]  NIFEdipine (PROCARDIA-XL/ADALAT CC) 60 MG 24 hr tablet Take 1 tablet (  60 mg total) by mouth daily. 02/12/17   Orpah Cobb, MD  nitroGLYCERIN (NITROSTAT) 0.4 MG SL tablet Place 1 tablet (0.4 mg total) under the tongue every 5 (five) minutes x 3 doses as needed for chest pain. 02/11/17   Orpah Cobb, MD  Omega 3-6-9 Fatty Acids (OMEGA-3-6-9 PO) Take 1 capsule by mouth daily.    [provider]  ranolazine (RANEXA) 500 MG 12 hr tablet Take 1 tablet (500 mg total) by mouth 2 (two) times daily. 02/11/17   Orpah Cobb, MD  vitamin E 400 UNIT capsule Take 400 Units by mouth daily.    [provider]    Family History Family History  Problem Relation Age of Onset  . Heart attack Mother 28  . Heart attack  Father 47  . Heart disease Brother     Social History Social History   Tobacco Use  . Smoking status: Never Smoker  . Smokeless tobacco: Never Used  Substance Use Topics  . Alcohol use: No  . Drug use: No     Allergies   Tape   Review of Systems Review of Systems  Constitutional: Negative for chills and fever.  Respiratory: Negative for cough, chest tightness and shortness of breath.   Cardiovascular: Negative for chest pain, palpitations and leg swelling.  Gastrointestinal: Negative for abdominal pain, diarrhea, nausea and vomiting.  Genitourinary: Positive for flank pain and frequency. Negative for dysuria, pelvic pain, vaginal bleeding, vaginal discharge and vaginal pain.  Musculoskeletal: Negative for arthralgias, myalgias, neck pain and neck stiffness.  Skin: Negative for rash.  Neurological: Negative for dizziness, weakness and headaches.  All other systems reviewed and are negative.    Physical Exam Updated Vital Signs BP 121/67 (BP Location: Right Arm)   Pulse 81   Temp 98.1 F (36.7 C) (Oral)   Resp 18   SpO2 100%   Physical Exam  Constitutional: She appears well-developed and well-nourished. No distress.  HENT:  Head: Normocephalic.  Eyes: Conjunctivae are normal.  Neck: Neck supple.  Cardiovascular: Normal rate, regular rhythm and normal heart sounds.  Pulmonary/Chest: Effort normal and breath sounds normal. No respiratory distress. She has no wheezes. She has no rales.  Abdominal: Soft. Bowel sounds are normal. She exhibits no distension. There is no tenderness. There is no rebound.  No CVA tenderness bilaterally  Musculoskeletal: She exhibits no edema.  Neurological: She is alert.  Skin: Skin is warm and dry.  Psychiatric: She has a normal mood and affect. Her behavior is normal.  Nursing note and vitals reviewed.    ED Treatments / Results  Labs (all labs ordered are listed, but only abnormal results are displayed) Labs Reviewed    URINALYSIS, ROUTINE W REFLEX MICROSCOPIC - Abnormal; Notable for the following components:      Result Value   Color, Urine AMBER (*)    APPearance HAZY (*)    Protein, ur 100 (*)    Leukocytes, UA TRACE (*)    All other components within normal limits  CBC - Abnormal; Notable for the following components:   Hemoglobin 11.8 (*)    All other components within normal limits  BASIC METABOLIC PANEL - Abnormal; Notable for the following components:   Glucose, Bld 221 (*)    Creatinine, Ser 1.16 (*)    GFR calc non Af Amer 47 (*)    GFR calc Af Amer 55 (*)    All other components within normal limits  HEPATIC FUNCTION PANEL - Abnormal; Notable for the following  components:   Bilirubin, Direct <0.1 (*)    All other components within normal limits  URINE CULTURE  LIPASE, BLOOD    EKG None  Radiology Ct Renal Stone Study  Result Date: 02/20/2018 CLINICAL DATA:  Right-sided flank pain. EXAM: CT ABDOMEN AND PELVIS WITHOUT CONTRAST TECHNIQUE: Multidetector CT imaging of the abdomen and pelvis was performed following the standard protocol without IV contrast. COMPARISON:  None. FINDINGS: Lower chest: Clear lung bases. Normal heart size without pericardial or pleural effusion. Tiny hiatal hernia. Hepatobiliary: Mild artifact degradation throughout the upper abdomen. Normal liver. Normal gallbladder, without biliary ductal dilatation. Pancreas: Normal, without mass or ductal dilatation. Spleen: Normal in size, without focal abnormality. Adrenals/Urinary Tract: Normal adrenal glands. No renal calculi or hydronephrosis. A left-sided abdominal calcification measures 5 mm on image 33/3. This is favored to be positioned just lateral to the left ureter, including on image 33/3. Otherwise, no ureteric stones or hydroureter. No bladder calculi. Stomach/Bowel: Normal stomach, without wall thickening. Colonic stool burden suggests constipation. Normal terminal ileum and appendix. Normal small bowel.  Vascular/Lymphatic: Aortic and branch vessel atherosclerosis. No abdominopelvic adenopathy. Reproductive: Normal uterus and adnexa. Other: No significant free fluid. Musculoskeletal: L3-4 disc bulge. IMPRESSION: 1. No right-sided urinary tract calculi or explanation for right flank pain. 2. A left abdominal calcification is favored to be vascular, positioned adjacent to the left ureter. If the patient has left-sided symptoms, ureteric stone cannot be excluded. 3.  Possible constipation. 4. Normal appendix. 5.  Aortic Atherosclerosis (ICD10-I70.0). Electronically Signed   By: Jeronimo Greaves M.D.   On: 02/20/2018 10:54    Procedures Procedures (including critical care time)  Medications Ordered in ED Medications - No data to display   Initial Impression / Assessment and Plan / ED Course  I have reviewed the triage vital signs and the nursing notes.  Pertinent labs & imaging results that were available during my care of the patient were reviewed by me and considered in my medical decision making (see chart for details).     Patient with right flank pain, ongoing for a few weeks, worse in the last 2 days.  Has had 2 rounds of antibiotics.  States pain is sharp, comes and goes with severity.  She is in no acute distress on my examination.  Vital signs are within normal.  She has no CVA or abdominal tenderness.  Symptoms suspicious for possible kidney stone.  I will get labs, urine analysis, CT renal for further evaluation.  Also will add hepatic function panel and lipase to basic labs that were already obtained at triage.  Patient does not want any medications at this time.  11:20 AM Urinalysis is negative for nitrites and only trace leukocytes.  11-20 red blood cells and 21-50 white blood cells with no bacteria.  She is not currently on any antibiotics.  I will send culture of this urine.  She has normal white blood cell count of 5.3.  She is afebrile.  Normal vital signs.  CT scan showed no right  kidney abnormality, specifically no stone.  It did show potential left-sided stone, however patient does not have much symptoms on the left side.  Question whether the pain could be musculoskeletal.  Discussed with Dr. Hyacinth Meeker who has seen patient as well.  Discussed whether this could be musculoskeletal pain.  Will start on Robaxin.  Urine culture sent.  We will have her follow-up with her doctor.  She has an appointment this Thursday.  Return precautions discussed.  Vitals:  02/20/18 0943  BP: 121/67  Pulse: 81  Resp: 18  Temp: 98.1 F (36.7 C)  TempSrc: Oral  SpO2: 100%     Final Clinical Impressions(s) / ED Diagnoses   Final diagnoses:  Right flank pain    ED Discharge Orders    None       Jaynie Crumble, PA-C 02/20/18 1131    Eber Hong, MD 02/20/18 1133

## 2018-02-20 NOTE — Discharge Instructions (Addendum)
Your urinalysis did not show any obvious signs of infection.  We did send cultures and if it comes back positive for bacterial infection we will contact you.  Your blood work today looks good as well.  Take Robaxin as prescribed as needed for possible muscular spasms.  Follow-up with your doctor scheduled.  Return if worsening

## 2018-02-21 LAB — URINE CULTURE: CULTURE: NO GROWTH

## 2018-03-27 ENCOUNTER — Other Ambulatory Visit: Payer: Self-pay | Admitting: Nurse Practitioner

## 2018-03-27 DIAGNOSIS — Z1231 Encounter for screening mammogram for malignant neoplasm of breast: Secondary | ICD-10-CM

## 2018-04-14 ENCOUNTER — Ambulatory Visit
Admission: RE | Admit: 2018-04-14 | Discharge: 2018-04-14 | Disposition: A | Discharge status: Home / Self Care | Payer: Medicaid Other | Source: Ambulatory Visit | Attending: Nurse Practitioner | Admitting: Nurse Practitioner

## 2018-04-14 DIAGNOSIS — Z1231 Encounter for screening mammogram for malignant neoplasm of breast: Secondary | ICD-10-CM

## 2018-05-09 ENCOUNTER — Ambulatory Visit: Payer: Medicaid Other | Admitting: Family Medicine

## 2018-05-17 ENCOUNTER — Ambulatory Visit: Payer: Medicaid Other | Admitting: Family Medicine

## 2018-05-23 ENCOUNTER — Other Ambulatory Visit: Payer: Self-pay | Admitting: Physical Medicine and Rehabilitation

## 2018-05-23 DIAGNOSIS — M542 Cervicalgia: Secondary | ICD-10-CM

## 2018-06-27 DIAGNOSIS — R011 Cardiac murmur, unspecified: Secondary | ICD-10-CM | POA: Insufficient documentation

## 2018-06-27 DIAGNOSIS — E559 Vitamin D deficiency, unspecified: Secondary | ICD-10-CM | POA: Insufficient documentation

## 2019-11-28 ENCOUNTER — Observation Stay (HOSPITAL_COMMUNITY)
Admission: AD | Admit: 2019-11-28 | Discharge: 2019-11-29 | Disposition: A | Discharge status: Home / Self Care | Payer: Medicaid Other | Source: Ambulatory Visit | Attending: Cardiovascular Disease | Admitting: Cardiovascular Disease

## 2019-11-28 DIAGNOSIS — I25110 Atherosclerotic heart disease of native coronary artery with unstable angina pectoris: Principal | ICD-10-CM | POA: Insufficient documentation

## 2019-11-28 DIAGNOSIS — Z20822 Contact with and (suspected) exposure to covid-19: Secondary | ICD-10-CM | POA: Diagnosis not present

## 2019-11-28 DIAGNOSIS — Z7982 Long term (current) use of aspirin: Secondary | ICD-10-CM | POA: Insufficient documentation

## 2019-11-28 DIAGNOSIS — I2089 Other forms of angina pectoris: Secondary | ICD-10-CM | POA: Diagnosis present

## 2019-11-28 DIAGNOSIS — Z888 Allergy status to other drugs, medicaments and biological substances status: Secondary | ICD-10-CM | POA: Insufficient documentation

## 2019-11-28 DIAGNOSIS — I083 Combined rheumatic disorders of mitral, aortic and tricuspid valves: Secondary | ICD-10-CM | POA: Insufficient documentation

## 2019-11-28 DIAGNOSIS — M1990 Unspecified osteoarthritis, unspecified site: Secondary | ICD-10-CM | POA: Insufficient documentation

## 2019-11-28 DIAGNOSIS — I249 Acute ischemic heart disease, unspecified: Secondary | ICD-10-CM | POA: Diagnosis present

## 2019-11-28 DIAGNOSIS — Z79899 Other long term (current) drug therapy: Secondary | ICD-10-CM | POA: Insufficient documentation

## 2019-11-28 DIAGNOSIS — Z8249 Family history of ischemic heart disease and other diseases of the circulatory system: Secondary | ICD-10-CM | POA: Insufficient documentation

## 2019-11-28 DIAGNOSIS — E1165 Type 2 diabetes mellitus with hyperglycemia: Secondary | ICD-10-CM | POA: Diagnosis not present

## 2019-11-28 DIAGNOSIS — E785 Hyperlipidemia, unspecified: Secondary | ICD-10-CM | POA: Diagnosis present

## 2019-11-28 DIAGNOSIS — Z794 Long term (current) use of insulin: Secondary | ICD-10-CM | POA: Insufficient documentation

## 2019-11-28 DIAGNOSIS — I208 Other forms of angina pectoris: Secondary | ICD-10-CM | POA: Diagnosis present

## 2019-11-28 DIAGNOSIS — I10 Essential (primary) hypertension: Secondary | ICD-10-CM | POA: Diagnosis present

## 2019-11-28 DIAGNOSIS — R002 Palpitations: Secondary | ICD-10-CM | POA: Diagnosis present

## 2019-11-28 LAB — CBC WITH DIFFERENTIAL/PLATELET
Abs Immature Granulocytes: 0.01 10*3/uL (ref 0.00–0.07)
Basophils Absolute: 0 10*3/uL (ref 0.0–0.1)
Basophils Relative: 1 %
Eosinophils Absolute: 0.2 10*3/uL (ref 0.0–0.5)
Eosinophils Relative: 3 %
HCT: 40.7 % (ref 36.0–46.0)
Hemoglobin: 13.2 g/dL (ref 12.0–15.0)
Immature Granulocytes: 0 %
Lymphocytes Relative: 36 %
Lymphs Abs: 2.1 10*3/uL (ref 0.7–4.0)
MCH: 28.4 pg (ref 26.0–34.0)
MCHC: 32.4 g/dL (ref 30.0–36.0)
MCV: 87.5 fL (ref 80.0–100.0)
Monocytes Absolute: 0.4 10*3/uL (ref 0.1–1.0)
Monocytes Relative: 7 %
Neutro Abs: 3.2 10*3/uL (ref 1.7–7.7)
Neutrophils Relative %: 53 %
Platelets: 259 10*3/uL (ref 150–400)
RBC: 4.65 MIL/uL (ref 3.87–5.11)
RDW: 14.3 % (ref 11.5–15.5)
WBC: 6 10*3/uL (ref 4.0–10.5)
nRBC: 0 % (ref 0.0–0.2)

## 2019-11-28 LAB — COMPREHENSIVE METABOLIC PANEL
ALT: 25 U/L (ref 0–44)
AST: 21 U/L (ref 15–41)
Albumin: 3.6 g/dL (ref 3.5–5.0)
Alkaline Phosphatase: 58 U/L (ref 38–126)
Anion gap: 12 (ref 5–15)
BUN: 19 mg/dL (ref 8–23)
CO2: 22 mmol/L (ref 22–32)
Calcium: 9.3 mg/dL (ref 8.9–10.3)
Chloride: 105 mmol/L (ref 98–111)
Creatinine, Ser: 0.7 mg/dL (ref 0.44–1.00)
GFR calc Af Amer: >60 mL/min (ref >60)
GFR calc non Af Amer: >60 mL/min (ref >60)
Glucose, Bld: 150 mg/dL — ABNORMAL HIGH (ref 70–99)
Potassium: 3.9 mmol/L (ref 3.5–5.1)
Sodium: 139 mmol/L (ref 135–145)
Total Bilirubin: 0.5 mg/dL (ref 0.3–1.2)
Total Protein: 7 g/dL (ref 6.5–8.1)

## 2019-11-28 LAB — TROPONIN I (HIGH SENSITIVITY)
Troponin I (High Sensitivity): 5 ng/L (ref <18)
Troponin I (High Sensitivity): 4 ng/L (ref <18)

## 2019-11-28 LAB — HEMOGLOBIN A1C
Hgb A1c MFr Bld: 7.8 % — ABNORMAL HIGH (ref 4.8–5.6)
Mean Plasma Glucose: 177.16 mg/dL

## 2019-11-28 LAB — GLUCOSE, CAPILLARY: Glucose-Capillary: 201 mg/dL — ABNORMAL HIGH (ref 70–99)

## 2019-11-28 LAB — HIV ANTIBODY (ROUTINE TESTING W REFLEX): HIV Screen 4th Generation wRfx: NONREACTIVE

## 2019-11-28 MED ORDER — ACETAMINOPHEN 325 MG PO TABS: 650.0000 mg | ORAL_TABLET | ORAL | Status: DC | PRN

## 2019-11-28 MED ORDER — HYDROXYZINE HCL 25 MG PO TABS: 25.0000 mg | ORAL_TABLET | Freq: Every evening | ORAL | Status: DC | PRN

## 2019-11-28 MED ORDER — HEPARIN (PORCINE) 25000 UT/250ML-% IV SOLN
750.0000 [IU]/h | INTRAVENOUS | Status: DC
  Administered 2019-11-28: 750 [IU]/h via INTRAVENOUS
  Filled 2019-11-28: qty 250

## 2019-11-28 MED ORDER — EZETIMIBE-SIMVASTATIN 10-40 MG PO TABS
1.0000 | ORAL_TABLET | Freq: Every day | ORAL | Status: DC
  Administered 2019-11-28 – 2019-11-29 (×2): 1 via ORAL
  Filled 2019-11-28 (×2): qty 1

## 2019-11-28 MED ORDER — CLONAZEPAM 0.5 MG PO TABS
2.0000 mg | ORAL_TABLET | Freq: Every day | ORAL | Status: DC
  Administered 2019-11-28: 2 mg via ORAL
  Filled 2019-11-28: qty 4

## 2019-11-28 MED ORDER — GLIPIZIDE 5 MG PO TABS
5.0000 mg | ORAL_TABLET | Freq: Two times a day (BID) | ORAL | Status: DC
  Administered 2019-11-29: 5 mg via ORAL
  Filled 2019-11-28 (×3): qty 1

## 2019-11-28 MED ORDER — METFORMIN HCL 500 MG PO TABS
1000.0000 mg | ORAL_TABLET | Freq: Two times a day (BID) | ORAL | Status: DC
  Administered 2019-11-29: 1000 mg via ORAL
  Filled 2019-11-28 (×2): qty 2

## 2019-11-28 MED ORDER — ONDANSETRON HCL 4 MG/2ML IJ SOLN: 4.0000 mg | Freq: Four times a day (QID) | INTRAMUSCULAR | Status: DC | PRN

## 2019-11-28 MED ORDER — HEPARIN BOLUS VIA INFUSION
3000.0000 [IU] | Freq: Once | INTRAVENOUS | Status: AC
  Administered 2019-11-28: 3000 [IU] via INTRAVENOUS
  Filled 2019-11-28: qty 3000

## 2019-11-28 MED ORDER — PREGABALIN 75 MG PO CAPS
75.0000 mg | ORAL_CAPSULE | Freq: Every day | ORAL | Status: DC
  Administered 2019-11-28: 75 mg via ORAL
  Filled 2019-11-28: qty 1

## 2019-11-28 MED ORDER — ASPIRIN EC 81 MG PO TBEC
81.0000 mg | DELAYED_RELEASE_TABLET | Freq: Every day | ORAL | Status: DC
  Administered 2019-11-29: 09:00:00 81 mg via ORAL
  Filled 2019-11-28: qty 1

## 2019-11-28 MED ORDER — NIFEDIPINE ER OSMOTIC RELEASE 30 MG PO TB24
30.0000 mg | ORAL_TABLET | Freq: Two times a day (BID) | ORAL | Status: DC
  Administered 2019-11-28 – 2019-11-29 (×2): 30 mg via ORAL
  Filled 2019-11-28 (×3): qty 1

## 2019-11-28 MED ORDER — ASCORBIC ACID 500 MG PO TABS
1000.0000 mg | ORAL_TABLET | Freq: Every day | ORAL | Status: DC
  Administered 2019-11-29: 1000 mg via ORAL
  Filled 2019-11-28: qty 2

## 2019-11-28 MED ORDER — PANTOPRAZOLE SODIUM 40 MG PO TBEC
40.0000 mg | DELAYED_RELEASE_TABLET | Freq: Every day | ORAL | Status: DC
  Administered 2019-11-29: 40 mg via ORAL
  Filled 2019-11-28: qty 1

## 2019-11-28 MED ORDER — ALBUTEROL SULFATE (2.5 MG/3ML) 0.083% IN NEBU: 2.5000 mg | INHALATION_SOLUTION | Freq: Four times a day (QID) | RESPIRATORY_TRACT | Status: DC | PRN

## 2019-11-28 MED ORDER — ENALAPRIL MALEATE 5 MG PO TABS
10.0000 mg | ORAL_TABLET | Freq: Two times a day (BID) | ORAL | Status: DC
  Administered 2019-11-28 – 2019-11-29 (×2): 10 mg via ORAL
  Filled 2019-11-28 (×3): qty 2

## 2019-11-28 MED ORDER — RANOLAZINE ER 500 MG PO TB12
500.0000 mg | ORAL_TABLET | Freq: Two times a day (BID) | ORAL | Status: DC
  Administered 2019-11-28 – 2019-11-29 (×2): 500 mg via ORAL
  Filled 2019-11-28 (×2): qty 1

## 2019-11-28 MED ORDER — NITROGLYCERIN 0.4 MG SL SUBL: 0.4000 mg | SUBLINGUAL_TABLET | SUBLINGUAL | Status: DC | PRN

## 2019-11-28 MED ORDER — ASPIRIN 300 MG RE SUPP
300.0000 mg | RECTAL | Status: AC
  Filled 2019-11-28: qty 1

## 2019-11-28 MED ORDER — INSULIN GLARGINE 100 UNIT/ML ~~LOC~~ SOLN: 20.0000 [IU] | Freq: Every day | SUBCUTANEOUS | Status: DC

## 2019-11-28 MED ORDER — ASPIRIN 81 MG PO CHEW
324.0000 mg | CHEWABLE_TABLET | ORAL | Status: AC
  Administered 2019-11-28: 324 mg via ORAL
  Filled 2019-11-28: qty 4

## 2019-11-28 MED ORDER — CO Q-10 120 MG PO CAPS: 120.0000 mg | ORAL_CAPSULE | Freq: Every day | ORAL | Status: DC

## 2019-11-28 MED ORDER — INSULIN ASPART 100 UNIT/ML ~~LOC~~ SOLN: 0.0000 [IU] | Freq: Three times a day (TID) | SUBCUTANEOUS | Status: DC

## 2019-11-28 NOTE — Progress Notes (Signed)
Admission Note:  69 y/o AAF presents as a direct admission to 3E, room 30, under services of Dr. Algie Coffer. Pt presents sitting side of bed, AAOx3. Pt is confused as to her situation here. Oriented to room. Pt denies cp, sob at this time. See nursing assessment. Will con't to monitor.

## 2019-11-28 NOTE — H&P (Signed)
Referring Physician:    [name] is an 69 y.o. female.                       Chief Complaint: Chest pain  HPI: 69 years old black female from [place] has recurrent chest pain x 2 months. It is retrosternal, with exertion and shortness of breath. PMH include type 2 DM, hypertension, arthritis and hyperlipidemia. She denies fever and cough.  Past Medical History:  Diagnosis Date  . Anginal pain ([place]) 02/10/2017   AT REST  . Arthritis   . Coronary artery disease   . Diabetes mellitus without complication ([place])   . Hyperlipemia   . Hypertension   . Insomnia       Past Surgical History:  Procedure Laterality Date  . CARDIAC CATHETERIZATION N/A 09/24/2016   Procedure: Left Heart Cath and Coronary Angiography;  Surgeon: Kim Dials, MD;  Location: [facility] CV LAB;  Service: Cardiovascular;  Laterality: N/A;  . CESAREAN SECTION    . CORONARY ANGIOGRAM  2005    Family History  Problem Relation Age of Onset  . Heart attack Mother 39  . Heart attack Father 88  . Heart disease Brother   . Breast cancer Neg Hx    Social History:  reports that she has never smoked. She has never used smokeless tobacco. She reports that she does not drink alcohol or use drugs.  Allergies:  Allergies  Allergen Reactions  . Tape Itching    Medications Prior to Admission  Medication Sig Dispense Refill  . aspirin EC 81 MG tablet Take 81 mg by mouth daily.    Marland Kitchen atenolol (TENORMIN) 50 MG tablet Take 50 mg by mouth daily.    Marland Kitchen atorvastatin (LIPITOR) 40 MG tablet Take 1 tablet (40 mg total) by mouth daily. 30 tablet 3  . enalapril (VASOTEC) 10 MG tablet Take 10 mg by mouth 2 (two) times daily.    Marland Kitchen glipiZIDE (GLUCOTROL) 10 MG tablet Take 10 mg by mouth 2 (two) times daily before a meal.    . insulin glargine (LANTUS) 100 UNIT/ML injection Inject 12 Units into the skin at bedtime.    . metFORMIN (GLUCOPHAGE) 500 MG tablet Take 1,000 mg by mouth 2 (two) times daily with a meal.    .  methocarbamol (ROBAXIN) 500 MG tablet Take 1 tablet (500 mg total) by mouth 2 (two) times daily. 20 tablet 0  . NIFEdipine (PROCARDIA-XL/ADALAT CC) 60 MG 24 hr tablet Take 1 tablet (60 mg total) by mouth daily.    . nitroGLYCERIN (NITROSTAT) 0.4 MG SL tablet Place 1 tablet (0.4 mg total) under the tongue every 5 (five) minutes x 3 doses as needed for chest pain. 25 tablet 1  . Omega 3-6-9 Fatty Acids (OMEGA-3-6-9 PO) Take 1 capsule by mouth daily.    . ranolazine (RANEXA) 500 MG 12 hr tablet Take 1 tablet (500 mg total) by mouth 2 (two) times daily. 60 tablet 3  . vitamin E 400 UNIT capsule Take 400 Units by mouth daily.      No results found for this or any previous visit (from the past 48 hour(s)). No results found.  Review Of Systems Constitutional: No fever, chills, weight loss or gain. Eyes: No vision change, wears glasses. No discharge or pain. Ears: No hearing loss, No tinnitus. Respiratory: No asthma, COPD, pneumonias. Positive shortness of breath. No hemoptysis. Cardiovascular: Positive chest pain, palpitation, no leg edema. Gastrointestinal: No nausea, vomiting, diarrhea, constipation. No GI  bleed. No hepatitis. Genitourinary: No dysuria, hematuria, kidney stone. No incontinance. Neurological: No headache, stroke, seizures.  Psychiatry: No psych facility admission for anxiety, depression, suicide. No detox. Skin: No rash. Musculoskeletal: Positive joint pain, no fibromyalgia. No neck pain, back pain. Lymphadenopathy: No lymphadenopathy. Hematology: No anemia or easy bruising.   Blood pressure 120/69, pulse 79, temperature 99 F (37.2 C), temperature source Oral, resp. rate 20, height 5\' 1"  (1.549 m), weight 64.3 kg. Body mass index is 26.77 kg/m. General appearance: alert, cooperative, appears stated age and no distress Head: Normocephalic, atraumatic. Eyes: Brown eyes, pink conjunctiva, corneas clear. PERRL, EOM's intact. Neck: No adenopathy, no carotid bruit, no JVD,  supple, symmetrical, trachea midline and thyroid not enlarged. Resp: Clear to auscultation bilaterally. Cardio: Regular rate and rhythm, S1, S2 normal, II/VI systolic murmur, no click, rub or gallop GI: Soft, non-tender; bowel sounds normal; no organomegaly. Extremities: No edema, cyanosis or clubbing. Skin: Warm and dry.  Neurologic: Alert and oriented X 3, normal strength. Normal coordination and gait.  Assessment/Plan Acute coronary syndrome Type 2 DM Hypertension Hyperlipidemia  IV heparin. R/O MI. NM myocardial perfusion stress test in AM.  Time spent: Review of old records, Lab, x-rays, EKG, other cardiac tests, examination, discussion with patient over 70 minutes.  , MD  11/28/2019, 5:40 PM

## 2019-11-28 NOTE — Progress Notes (Signed)
ANTICOAGULATION CONSULT NOTE - Initial Consult  Pharmacy Consult:  Heparin Indication: chest pain/ACS  Allergies  Allergen Reactions  . Tape Itching    Patient Measurements: Height: 5\' 1"  (154.9 cm) Weight: 141 lb 11.2 oz (64.3 kg)(scale B) IBW/kg (Calculated) : 47.8 Heparin Dosing Weight: 64 kg  Vital Signs: Temp: 99 F (37.2 C) (03/03 1632) Temp Source: Oral (03/03 1632) BP: 120/69 (03/03 1632) Pulse Rate: 79 (03/03 1632)  Labs: No results for input(s): HGB, HCT, PLT, APTT, LABPROT, INR, HEPARINUNFRC, HEPRLOWMOCWT, CREATININE, CKTOTAL, CKMB, TROPONINIHS in the last 72 hours.  CrCl cannot be calculated (Patient's most recent lab result is older than the maximum 21 days allowed.).   Medical History: Past Medical History:  Diagnosis Date  . Anginal pain (HCC) 02/10/2017   AT REST  . Arthritis   . Coronary artery disease   . Diabetes mellitus without complication (HCC)   . Hyperlipemia   . Hypertension   . Insomnia     Assessment: No note available in chart yet.  Patient is a 37 YOF who is directly admitted from home.  Per RN, she has angina at rest and will undergo stress test.  Pharmacy consulted to dose IV heparin for ACS.  Verified with patient that she is not on any blood thinner at home.  Baseline labs have been collected per patient and RN.  Goal of Therapy:  Heparin level 0.3-0.7 units/ml Monitor platelets by anticoagulation protocol: Yes   Plan:  Heparin 3000 units IV bolus x 1, then Heparin gtt at 750 units/hr Check 8 hr heparin level  Daily heparin level and CBC  Navid Lenzen D. 64, PharmD, BCPS, BCCCP 11/28/2019, 5:28 PM

## 2019-11-29 ENCOUNTER — Observation Stay (HOSPITAL_COMMUNITY): Payer: Medicaid Other

## 2019-11-29 ENCOUNTER — Other Ambulatory Visit: Payer: Self-pay

## 2019-11-29 ENCOUNTER — Encounter (HOSPITAL_COMMUNITY): Payer: Self-pay | Admitting: Cardiovascular Disease

## 2019-11-29 DIAGNOSIS — I25110 Atherosclerotic heart disease of native coronary artery with unstable angina pectoris: Secondary | ICD-10-CM | POA: Diagnosis not present

## 2019-11-29 HISTORY — PX: CARDIOVASCULAR STRESS TEST: SHX262

## 2019-11-29 LAB — CBC
HCT: 36.7 % (ref 36.0–46.0)
Hemoglobin: 12.1 g/dL (ref 12.0–15.0)
MCH: 28.7 pg (ref 26.0–34.0)
MCHC: 33 g/dL (ref 30.0–36.0)
MCV: 87 fL (ref 80.0–100.0)
Platelets: 209 10*3/uL (ref 150–400)
RBC: 4.22 MIL/uL (ref 3.87–5.11)
RDW: 14.4 % (ref 11.5–15.5)
WBC: 5.9 10*3/uL (ref 4.0–10.5)
nRBC: 0 % (ref 0.0–0.2)

## 2019-11-29 LAB — BASIC METABOLIC PANEL
Anion gap: 12 (ref 5–15)
BUN: 19 mg/dL (ref 8–23)
CO2: 23 mmol/L (ref 22–32)
Calcium: 9.2 mg/dL (ref 8.9–10.3)
Chloride: 106 mmol/L (ref 98–111)
Creatinine, Ser: 0.8 mg/dL (ref 0.44–1.00)
GFR calc Af Amer: >60 mL/min (ref >60)
GFR calc non Af Amer: >60 mL/min (ref >60)
Glucose, Bld: 174 mg/dL — ABNORMAL HIGH (ref 70–99)
Potassium: 3.7 mmol/L (ref 3.5–5.1)
Sodium: 141 mmol/L (ref 135–145)

## 2019-11-29 LAB — GLUCOSE, CAPILLARY
Glucose-Capillary: 128 mg/dL — ABNORMAL HIGH (ref 70–99)
Glucose-Capillary: 162 mg/dL — ABNORMAL HIGH (ref 70–99)
Glucose-Capillary: 100 mg/dL — ABNORMAL HIGH (ref 70–99)

## 2019-11-29 LAB — LIPID PANEL
Cholesterol: 164 mg/dL (ref 0–200)
HDL: 60 mg/dL (ref >40)
LDL Cholesterol: 83 mg/dL (ref 0–99)
Total CHOL/HDL Ratio: 2.7 RATIO
Triglycerides: 107 mg/dL (ref <150)
VLDL: 21 mg/dL (ref 0–40)

## 2019-11-29 LAB — ECHOCARDIOGRAM COMPLETE
Height: 61 in
Weight: 2283.2 oz

## 2019-11-29 LAB — SARS CORONAVIRUS 2 (TAT 6-24 HRS): SARS Coronavirus 2: NEGATIVE

## 2019-11-29 LAB — HEPARIN LEVEL (UNFRACTIONATED)
Heparin Unfractionated: 0.39 IU/mL (ref 0.30–0.70)
Heparin Unfractionated: 0.32 IU/mL (ref 0.30–0.70)

## 2019-11-29 MED ORDER — TECHNETIUM TC 99M TETROFOSMIN IV KIT
32.8000 | PACK | Freq: Once | INTRAVENOUS | Status: AC | PRN
  Administered 2019-11-29: 32.8 via INTRAVENOUS

## 2019-11-29 MED ORDER — REGADENOSON 0.4 MG/5ML IV SOLN
INTRAVENOUS | Status: AC
  Administered 2019-11-29: 0.4 mg via INTRAVENOUS
  Filled 2019-11-29: qty 5

## 2019-11-29 MED ORDER — TECHNETIUM TC 99M TETROFOSMIN IV KIT
10.3000 | PACK | Freq: Once | INTRAVENOUS | Status: AC | PRN
  Administered 2019-11-29: 10.3 via INTRAVENOUS

## 2019-11-29 MED ORDER — REGADENOSON 0.4 MG/5ML IV SOLN: 0.4000 mg | Freq: Once | INTRAVENOUS | Status: AC

## 2019-11-29 MED ORDER — LANTUS SOLOSTAR 100 UNIT/ML ~~LOC~~ SOPN: 32.0000 [IU] | PEN_INJECTOR | Freq: Every day | SUBCUTANEOUS | Status: DC

## 2019-11-29 MED ORDER — LANTUS SOLOSTAR 100 UNIT/ML ~~LOC~~ SOPN: 10.0000 [IU] | PEN_INJECTOR | Freq: Every day | SUBCUTANEOUS | 11 refills | Status: DC

## 2019-11-29 MED ORDER — NITROGLYCERIN 0.4 MG SL SUBL: 0.4000 mg | SUBLINGUAL_TABLET | SUBLINGUAL | 1 refills | Status: DC | PRN

## 2019-11-29 NOTE — Discharge Summary (Signed)
Physician Discharge Summary  Patient ID: Kim Gibson MRN: [id number] DOB/AGE: [birth year] 69 y.o.  Admit date: 11/28/2019 Discharge date: 11/29/2019  Admission Diagnoses: Acute coronary syndrome Type 2 DM Hypertension Hyperlipidemia  Discharge Diagnoses:  Principal Problem:   Unstable angina Active Problems:   HTN (hypertension)   HLD (hyperlipidemia)   Type 2 DM with hyperglycemia   Mild aortic valve stenosis   Moderate MR   Moderate TR   Discharged Condition: good  Hospital Course: 69 years old black female with PMH of type 2 DM, hypertension, arthritis and hyperlipidemia has recurrent chest pain x 2 months. Her EKG was normal. She had no reversible ischemia on NM myocardial perfusion stress test. Her echocardiogram showed normal LV systolic function, Mild diastolic dysfunction, mild aortic stenosis, moderate MR and moderate TR.  She may increase her Lantus Insulin dose to 32 units from 30 units q AM for Hgb A1C of 7.8 %. Her medications were adjusted and she was discharged home in stable condition with f/u by me in 1 week and by primary care in 1 month.   Consults: cardiology  Significant Diagnostic Studies: labs: Normal CBC, BMET except hyperglycemia, Lipid panel. Elevated Hgb A1C of 7.8 %.  EKG: Normal.  Nuclear stress test: No reversible ischemia.  Echocardiogram: Normal LV systolic function with mild AS, moderate MR and TR.  Treatments: cardiac meds: Aspirin, Ranolazine,  Atenolol, felodipine and Vytorin.  Discharge Exam: Blood pressure (!) 148/92, pulse 67, temperature 98.1 F (36.7 C), temperature source Oral, resp. rate 17, height 5\' 1"  (1.549 m), weight 64.7 kg, SpO2 100 %. General appearance: alert, cooperative and appears stated age. Head: Normocephalic, atraumatic. Eyes: Brown eyes, pink conjunctiva, corneas clear. PERRL, EOM's intact.  Neck: No adenopathy, no carotid bruit, no JVD, supple, symmetrical, trachea midline and thyroid not enlarged. Resp:  Clear to auscultation bilaterally. Cardio: Regular rate and rhythm, S1, S2 normal, II/VI systolic murmur, no click, rub or gallop. GI: Soft, non-tender; bowel sounds normal; no organomegaly. Extremities: No edema, cyanosis or clubbing. Skin: Warm and dry.  Neurologic: Alert and oriented X 3, normal strength and tone. Normal coordination and gait.  Disposition: Discharge disposition: 01-Home or Self Care        Allergies as of 11/29/2019      Reactions   Tape Itching   Nitroglycerin Anxiety, Other (See Comments)   "Made me feel like I was going to faint- it knocked me out"      Medication List    TAKE these medications   ascorbic acid 1000 MG tablet Commonly known as: VITAMIN C Take 1,000 mg by mouth daily.   aspirin EC 81 MG tablet Take 81 mg by mouth at bedtime.   atenolol 50 MG tablet Commonly known as: TENORMIN Take 50 mg by mouth daily.   clonazePAM 2 MG tablet Commonly known as: KLONOPIN Take 2 mg by mouth at bedtime.   Co Q-10 120 MG Caps Take 120 mg by mouth daily.   enalapril 10 MG tablet Commonly known as: VASOTEC Take 10 mg by mouth 2 (two) times daily.   ezetimibe-simvastatin 10-40 MG tablet Commonly known as: VYTORIN Take 1 tablet by mouth at bedtime.   Garlique 400 MG Tbec Generic drug: Garlic Take 557 mg by mouth daily.   hydrOXYzine 25 MG tablet Commonly known as: ATARAX/VISTARIL Take 25 mg by mouth at bedtime as needed for itching.   Lantus SoloStar 100 UNIT/ML Solostar Pen Generic drug: insulin glargine Inject 32 Units into the skin daily after breakfast. What  changed: how much to take   NIFEdipine 30 MG 24 hr tablet Commonly known as: PROCARDIA-XL/NIFEDICAL-XL Take 30 mg by mouth in the morning and at bedtime.   OMEGA-3-6-9 PO Take 1 capsule by mouth in the morning and at bedtime.   omeprazole 20 MG capsule Commonly known as: PRILOSEC Take 20 mg by mouth daily as needed (for indigestion).   ProAir HFA 108 (90 Base) MCG/ACT  inhaler Generic drug: albuterol Inhale 2 puffs into the lungs 4 (four) times daily as needed for wheezing or shortness of breath.   ranolazine 500 MG 12 hr tablet Commonly known as: RANEXA Take 1 tablet (500 mg total) by mouth 2 (two) times daily.   vitamin E 180 MG (400 UNITS) capsule Take 400 Units by mouth daily.   Xigduo XR 01-999 MG Tb24 Generic drug: Dapagliflozin-metFORMIN HCl ER Take 1 tablet by mouth in the morning and at bedtime.      Follow-up Information    Valerie Roys, FNP. Schedule an appointment as soon as possible for a visit in 1 month(s).   Specialty: Nurse Practitioner Contact information: [address] [place] [address] [phone]        Orpah Cobb, MD. Schedule an appointment as soon as possible for a visit in 1 week(s).   Specialty: Cardiology Contact information: [address] [place] [address] [phone]           Time spent: Review of old chart, current chart, lab, x-ray, cardiac tests and discussion with patient over 60 minutes.  Signed: Ricki Rodriguez 11/29/2019, 5:40 PM

## 2019-11-29 NOTE — Progress Notes (Signed)
ANTICOAGULATION CONSULT NOTE   Pharmacy Consult:  Heparin Indication: chest pain/ACS  Allergies  Allergen Reactions  . Tape Itching  . Nitroglycerin Anxiety and Other (See Comments)    "Made me feel like I was going to faint- it knocked me out"    Patient Measurements: Height: 5\' 1"  (154.9 cm) Weight: 142 lb 11.2 oz (64.7 kg) IBW/kg (Calculated) : 47.8 Heparin Dosing Weight: 64 kg  Vital Signs: Temp: 98.1 F (36.7 C) (03/04 0925) Temp Source: Oral (03/04 0925) BP: 135/71 (03/04 0925) Pulse Rate: 67 (03/04 0925)  Labs: Recent Labs    11/28/19 1730 11/28/19 1904 11/29/19 0221 11/29/19 0919  HGB 13.2  --  12.1  --   HCT 40.7  --  36.7  --   PLT 259  --  209  --   HEPARINUNFRC  --   --  0.39 0.32  CREATININE 0.70  --  0.80  --   TROPONINIHS 5 4  --   --     Estimated Creatinine Clearance: 56.4 mL/min (by C-G formula based on SCr of 0.8 mg/dL).  Assessment: No note available in chart yet.  Patient is a 13 YOF who is directly admitted from home.  Per RN, she has angina at rest and will undergo stress test.  Pharmacy consulted to dose IV heparin for ACS. Plans noted for stress test today -heparin level at goal on 750 units/hr     Goal of Therapy:  Heparin level 0.3-0.7 units/ml Monitor platelets by anticoagulation protocol: Yes   Plan:  Continue heparin gtt at 750 units/hr Daily heparin level and CBC  66, PharmD Clinical Pharmacist **Pharmacist phone directory can now be found on [email or web address] ([email or web address]).  Listed under [facility].

## 2019-11-29 NOTE — Progress Notes (Signed)
  Echocardiogram 2D Echocardiogram has been performed.  Kim Gibson 11/29/2019, 9:21 AM

## 2019-11-29 NOTE — Progress Notes (Signed)
ANTICOAGULATION CONSULT NOTE   Pharmacy Consult:  Heparin Indication: chest pain/ACS  Allergies  Allergen Reactions  . Tape Itching  . Nitroglycerin Anxiety and Other (See Comments)    "Made me feel like I was going to faint- it knocked me out"    Patient Measurements: Height: 5\' 1"  (154.9 cm) Weight: 142 lb 11.2 oz (64.7 kg) IBW/kg (Calculated) : 47.8 Heparin Dosing Weight: 64 kg  Vital Signs: Temp: 98.4 F (36.9 C) (03/04 0328) Temp Source: Oral (03/04 0328) BP: 135/82 (03/04 0328) Pulse Rate: 73 (03/04 0328)  Labs: Recent Labs    11/28/19 1730 11/28/19 1904 11/29/19 0221  HGB 13.2  --  12.1  HCT 40.7  --  36.7  PLT 259  --  209  HEPARINUNFRC  --   --  0.39  CREATININE 0.70  --  0.80  TROPONINIHS 5 4  --     Estimated Creatinine Clearance: 56.4 mL/min (by C-G formula based on SCr of 0.8 mg/dL).  Assessment: No note available in chart yet.  Patient is a 21 YOF who is directly admitted from home.  Per RN, she has angina at rest and will undergo stress test.  Pharmacy consulted to dose IV heparin for ACS.  Verified with patient that she is not on any blood thinner at home.  Baseline labs have been collected per patient and RN.  Heparin level therapeutic (0.39) on gtt at 750 units/hr. No bleeding noted.  Goal of Therapy:  Heparin level 0.3-0.7 units/ml Monitor platelets by anticoagulation protocol: Yes   Plan:  Continue heparin gtt at 750 units/hr F/u 6 hr confirmatory heparin level  66, PharmD, BCPS Please see amion for complete clinical pharmacist phone list 11/29/2019, 3:42 AM

## 2019-12-16 DIAGNOSIS — I7090 Unspecified atherosclerosis: Secondary | ICD-10-CM | POA: Insufficient documentation

## 2019-12-16 DIAGNOSIS — F40298 Other specified phobia: Secondary | ICD-10-CM | POA: Insufficient documentation

## 2020-03-17 DIAGNOSIS — R82998 Other abnormal findings in urine: Secondary | ICD-10-CM | POA: Insufficient documentation

## 2020-04-28 DIAGNOSIS — R200 Anesthesia of skin: Secondary | ICD-10-CM | POA: Insufficient documentation

## 2020-04-28 DIAGNOSIS — G629 Polyneuropathy, unspecified: Secondary | ICD-10-CM | POA: Insufficient documentation

## 2020-05-22 ENCOUNTER — Other Ambulatory Visit: Payer: Self-pay

## 2020-05-22 ENCOUNTER — Ambulatory Visit
Admission: EM | Admit: 2020-05-22 | Discharge: 2020-05-22 | Disposition: A | Discharge status: Home / Self Care | Payer: Medicaid Other

## 2020-05-22 DIAGNOSIS — L03039 Cellulitis of unspecified toe: Secondary | ICD-10-CM | POA: Diagnosis not present

## 2020-05-22 DIAGNOSIS — M79675 Pain in left toe(s): Secondary | ICD-10-CM

## 2020-05-22 DIAGNOSIS — M79674 Pain in right toe(s): Secondary | ICD-10-CM

## 2020-05-22 MED ORDER — SULFAMETHOXAZOLE-TRIMETHOPRIM 800-160 MG PO TABS: 1.0000 | ORAL_TABLET | Freq: Two times a day (BID) | ORAL | 0 refills | Status: AC

## 2020-05-22 NOTE — ED Triage Notes (Signed)
Pt presents with complaints of pain in her toe nails that started x 2 days ago. States that she is a diabetic so she does her own toe nails. Concerned that the pain is continuing after she did her nails. Reports her pcp could not see her today. Reports using otc medication for the past couple of days with no relief.

## 2020-05-22 NOTE — Discharge Instructions (Addendum)
I have sent in Bactrim for you to take twice a day for 7 days  Follow up with primary care as needed

## 2020-05-23 NOTE — ED Provider Notes (Signed)
[facility]   [id number] 05/22/20 Arrival Time: 1135  CC: RASH  SUBJECTIVE:  Kim Gibson is a 69 y.o. female who presents with a skin complaint that began 2 days ago.  Reports that she is diabetic, and that she was trying to file her toenails.  Afterwards she has been experiencing pain and mild watery drainage from the tips of great toes. Denies precipitating event or trauma.  Has used Neosporin on the area.  There are no aggravating or alleviating factors. Denies fever, chills, nausea, vomiting, erythema, swelling, oral lesions, SOB, chest pain, abdominal pain, changes in bowel or bladder function.    ROS: As per HPI.  All other pertinent ROS negative.     Past Medical History:  Diagnosis Date  . Anginal pain (HCC) 02/10/2017   AT REST  . Arthritis   . Coronary artery disease   . Diabetes mellitus without complication (HCC)   . Hyperlipemia   . Hypertension   . Insomnia    Past Surgical History:  Procedure Laterality Date  . CARDIAC CATHETERIZATION N/A 09/24/2016   Procedure: Left Heart Cath and Coronary Angiography;  Surgeon: Orpah Cobb, MD;  Location: [facility] INVASIVE CV LAB;  Service: Cardiovascular;  Laterality: N/A;  . CARDIOVASCULAR STRESS TEST  11/29/2019  . CESAREAN SECTION    . CORONARY ANGIOGRAM  2005   Allergies  Allergen Reactions  . Tape Itching  . Nitroglycerin Anxiety and Other (See Comments)    "Made me feel like I was going to faint- it knocked me out"   No current facility-administered medications on file prior to encounter.   Current Outpatient Medications on File Prior to Encounter  Medication Sig Dispense Refill  . empagliflozin (JARDIANCE) 10 MG TABS tablet Take by mouth daily.    . metFORMIN (GLUCOPHAGE) 1000 MG tablet Take 1,000 mg by mouth 2 (two) times daily with a meal.    . albuterol (PROAIR HFA) 108 (90 Base) MCG/ACT inhaler Inhale 2 puffs into the lungs 4 (four) times daily as needed for wheezing or shortness of  breath.     Marland Kitchen ascorbic acid (VITAMIN C) 1000 MG tablet Take 1,000 mg by mouth daily.    Marland Kitchen aspirin EC 81 MG tablet Take 81 mg by mouth at bedtime.     Marland Kitchen atenolol (TENORMIN) 50 MG tablet Take 50 mg by mouth daily.    . clonazePAM (KLONOPIN) 2 MG tablet Take 2 mg by mouth at bedtime.    . Coenzyme Q10 (CO Q-10) 120 MG CAPS Take 120 mg by mouth daily.    . Dapagliflozin-metFORMIN HCl ER (XIGDUO XR) 01-999 MG TB24 Take 1 tablet by mouth in the morning and at bedtime.    . enalapril (VASOTEC) 10 MG tablet Take 10 mg by mouth 2 (two) times daily.    Marland Kitchen ezetimibe-simvastatin (VYTORIN) 10-40 MG tablet Take 1 tablet by mouth at bedtime.    . Garlic (GARLIQUE) 400 MG TBEC Take 400 mg by mouth daily.    . hydrOXYzine (ATARAX/VISTARIL) 25 MG tablet Take 25 mg by mouth at bedtime as needed for itching.     . insulin glargine (LANTUS SOLOSTAR) 100 UNIT/ML Solostar Pen Inject 32 Units into the skin daily after breakfast.    . NIFEdipine (PROCARDIA-XL/NIFEDICAL-XL) 30 MG 24 hr tablet Take 30 mg by mouth in the morning and at bedtime.    Ailene Ards 3-6-9 Fatty Acids (OMEGA-3-6-9 PO) Take 1 capsule by mouth in the morning and at bedtime.     Marland Kitchen  omeprazole (PRILOSEC) 20 MG capsule Take 20 mg by mouth daily as needed (for indigestion).    . ranolazine (RANEXA) 500 MG 12 hr tablet Take 1 tablet (500 mg total) by mouth 2 (two) times daily. 60 tablet 3  . vitamin E 400 UNIT capsule Take 400 Units by mouth daily.     Social History   Socioeconomic History  . Marital status: Married    Spouse name: Not on file  . Number of children: Not on file  . Years of education: Not on file  . Highest education level: Not on file  Occupational History  . Not on file  Tobacco Use  . Smoking status: Never Smoker  . Smokeless tobacco: Never Used  Vaping Use  . Vaping Use: Never used  Substance and Sexual Activity  . Alcohol use: No  . Drug use: No  . Sexual activity: Not on file  Other Topics Concern  . Not on file  Social  History Narrative  . Not on file   Social Determinants of Health   Financial Resource Strain:   . Difficulty of Paying Living Expenses: Not on file  Food Insecurity:   . Worried About Programme researcher, broadcasting/film/video in the Last Year: Not on file  . Ran Out of Food in the Last Year: Not on file  Transportation Needs:   . Lack of Transportation (Medical): Not on file  . Lack of Transportation (Non-Medical): Not on file  Physical Activity:   . Days of Exercise per Week: Not on file  . Minutes of Exercise per Session: Not on file  Stress:   . Feeling of Stress : Not on file  Social Connections:   . Frequency of Communication with Friends and Family: Not on file  . Frequency of Social Gatherings with Friends and Family: Not on file  . Attends Religious Services: Not on file  . Active Member of Clubs or Organizations: Not on file  . Attends Banker Meetings: Not on file  . Marital Status: Not on file  Intimate Partner Violence:   . Fear of Current or Ex-Partner: Not on file  . Emotionally Abused: Not on file  . Physically Abused: Not on file  . Sexually Abused: Not on file   Family History  Problem Relation Age of Onset  . Heart attack Mother 34  . Heart attack Father 25  . Heart disease Brother   . Breast cancer Neg Hx     OBJECTIVE: Vitals:   05/22/20 1221  BP: (!) 144/85  Pulse: 77  Resp: 18  Temp: 98.1 F (36.7 C)  SpO2: 93%    General appearance: alert; no distress Head: NCAT Lungs: clear to auscultation bilaterally Heart: regular rate and rhythm.  Radial pulse 2+ bilaterally Extremities: no edema Skin: warm and dry;clear drainage at medial aspects of bilateral great toes, medial aspect of L great to erythematous and TTP Psychological: alert and cooperative; normal mood and affect  ASSESSMENT & PLAN:  1. Cellulitis of toe, unspecified laterality   2. Pain in toes of both feet     Meds ordered this encounter  Medications  . sulfamethoxazole-trimethoprim  (BACTRIM DS) 800-160 MG tablet    Sig: Take 1 tablet by mouth 2 (two) times daily for 7 days.    Dispense:  14 tablet    Refill:  0    Order Specific Question:   Supervising Provider    Answer:   Merrilee Jansky [id number]    Prescribed Bactrim  May continue neosporin Take as prescribed and to completion Avoid hot showers/ baths Moisturize skin daily  Follow up with PCP if symptoms persists Return or go to the ER if you have any new or worsening symptoms such as fever, chills, nausea, vomiting, redness, swelling, discharge, if symptoms do not improve with medications  Reviewed expectations re: course of current medical issues. Questions answered. Outlined signs and symptoms indicating need for more acute intervention. Patient verbalized understanding. After Visit Summary given.   Moshe Cipro, NP 05/23/20 1510

## 2020-06-03 DIAGNOSIS — R634 Abnormal weight loss: Secondary | ICD-10-CM | POA: Insufficient documentation

## 2020-09-01 DIAGNOSIS — G5603 Carpal tunnel syndrome, bilateral upper limbs: Secondary | ICD-10-CM | POA: Insufficient documentation

## 2021-02-24 ENCOUNTER — Other Ambulatory Visit: Payer: Self-pay

## 2021-02-24 ENCOUNTER — Encounter (HOSPITAL_COMMUNITY): Payer: Self-pay

## 2021-02-24 ENCOUNTER — Ambulatory Visit (HOSPITAL_COMMUNITY)
Admission: EM | Admit: 2021-02-24 | Discharge: 2021-02-24 | Disposition: A | Discharge status: Home / Self Care | Payer: Medicaid Other | Attending: Family Medicine | Admitting: Family Medicine

## 2021-02-24 DIAGNOSIS — L089 Local infection of the skin and subcutaneous tissue, unspecified: Secondary | ICD-10-CM

## 2021-02-24 DIAGNOSIS — B9689 Other specified bacterial agents as the cause of diseases classified elsewhere: Secondary | ICD-10-CM

## 2021-02-24 MED ORDER — DOXYCYCLINE HYCLATE 100 MG PO CAPS: 100.0000 mg | ORAL_CAPSULE | Freq: Two times a day (BID) | ORAL | 0 refills | Status: DC

## 2021-02-24 NOTE — ED Triage Notes (Signed)
Pt presents with a small cut to the right big toe. She states she was getting a pedicure done and states the person doing the pedicure accidentally cut her toe.

## 2021-02-25 NOTE — ED Provider Notes (Signed)
[facility]   [id number] 02/24/21 Arrival Time: 1933  ASSESSMENT & PLAN:  1. Localized bacterial skin infection    No sign of paronychia needing I&D at this time.  Close observation. Begin: Meds ordered this encounter  Medications  . doxycycline (VIBRAMYCIN) 100 MG capsule    Sig: Take 1 capsule (100 mg total) by mouth 2 (two) times daily.    Dispense:  14 capsule    Refill:  0     Follow-up Information    Henri Medal, MD.   Specialty: Family Medicine Why: As needed. Contact information: [address] [place] [address] [phone]        [facility].   Specialty: Urgent Care Why: If worsening or failing to improve as anticipated. Contact information: [address] [place] [address] [phone]              Reviewed expectations re: course of current medical issues. Questions answered. Outlined signs and symptoms indicating need for more acute intervention. Patient verbalized understanding. After Visit Summary given.   SUBJECTIVE:  Kim Gibson is a 70 y.o. female with DM who presents with a possible infection of her R great toe; lateral nail fold with erythema after pedicure last week; tender at times. No drainage or bleeding. Afebrile. No tx PTA. Ambulatory without difficulty.  OBJECTIVE:  Vitals:   02/24/21 2020  BP: (!) 142/74  Pulse: 72  Resp: 17  Temp: 98.5 F (36.9 C)  SpO2: 97%    General appearance: alert; no distress RLE: lateral nailfold of great toe with TTP and mild overlying erythema; no areas of fluctuance Psychological: alert and cooperative; normal mood and affect  Allergies  Allergen Reactions  . Tape Itching  . Nitroglycerin Anxiety and Other (See Comments)    "Made me feel like I was going to faint- it knocked me out"    Past Medical History:  Diagnosis Date  . Anginal pain (HCC) 02/10/2017   AT REST  . Arthritis   .  Coronary artery disease   . Diabetes mellitus without complication (HCC)   . Hyperlipemia   . Hypertension   . Insomnia    Social History   Socioeconomic History  . Marital status: Married    Spouse name: Not on file  . Number of children: Not on file  . Years of education: Not on file  . Highest education level: Not on file  Occupational History  . Not on file  Tobacco Use  . Smoking status: Never Smoker  . Smokeless tobacco: Never Used  Vaping Use  . Vaping Use: Never used  Substance and Sexual Activity  . Alcohol use: No  . Drug use: No  . Sexual activity: Not on file  Other Topics Concern  . Not on file  Social History Narrative  . Not on file   Social Determinants of Health   Financial Resource Strain: Not on file  Food Insecurity: Not on file  Transportation Needs: Not on file  Physical Activity: Not on file  Stress: Not on file  Social Connections: Not on file   Family History  Problem Relation Age of Onset  . Heart attack Mother 104  . Heart attack Father 79  . Heart disease Brother   . Breast cancer Neg Hx    Past Surgical History:  Procedure Laterality Date  . CARDIAC CATHETERIZATION N/A 09/24/2016   Procedure: Left Heart Cath and Coronary Angiography;  Surgeon: Orpah Cobb, MD;  [facility]: [facility] INV[facility] CV [facility];  Service: Cardiovascular;  Laterality: N/A;  . CARDIOVASCULAR STRESS TEST  11/29/2019  . CESAREAN SECTION    . CORONARY ANGIOGRAM  [address]           Mardella Layman, MD 02/25/21 [phone]

## 2021-02-26 DIAGNOSIS — L600 Ingrowing nail: Secondary | ICD-10-CM | POA: Insufficient documentation

## 2021-03-10 DIAGNOSIS — H0263 Xanthelasma of right eye, unspecified eyelid: Secondary | ICD-10-CM | POA: Insufficient documentation

## 2021-03-11 DIAGNOSIS — B009 Herpesviral infection, unspecified: Secondary | ICD-10-CM | POA: Insufficient documentation

## 2021-03-11 DIAGNOSIS — A6004 Herpesviral vulvovaginitis: Secondary | ICD-10-CM | POA: Insufficient documentation

## 2021-03-18 ENCOUNTER — Telehealth: Payer: Self-pay

## 2021-03-18 NOTE — Telephone Encounter (Signed)
Returned patients call.  The A1C level was 7, she is on 81mg  ASA, BP has been average of 128/83, denies any previous neurologic issues in the past, nor has received Botox. Dr. will assess tomorrow how many units he would suggest to address her concerns. Patient understood and agreed with plan.

## 2021-03-18 NOTE — Telephone Encounter (Signed)
Patient called to let us know that she is diabetic, has high blood pressure, and high cholesterol.  She would like to know if this will impact her skin when she has botox tomorrow.  Please call.

## 2021-03-19 ENCOUNTER — Telehealth: Payer: Self-pay

## 2021-03-19 ENCOUNTER — Ambulatory Visit (INDEPENDENT_AMBULATORY_CARE_PROVIDER_SITE_OTHER): Payer: Self-pay | Admitting: Plastic Surgery

## 2021-03-19 ENCOUNTER — Other Ambulatory Visit: Payer: Self-pay

## 2021-03-19 VITALS — BP 144/79 | HR 83

## 2021-03-19 DIAGNOSIS — Z411 Encounter for cosmetic surgery: Secondary | ICD-10-CM

## 2021-03-19 NOTE — Telephone Encounter (Signed)
Returned patients call. Was not able to [facility] as the VM is not set up. Should she call back Do not expose the area to intense heat (eg solarium or sauna) Avoid pressure on the treated areas for the first few nights (i.e. sleep on back of possible) Avoid strenuous exercise and alcohol for 24 hours. Do not use AHA, Retinols/Vitamin C therapy or oil based make-up for 24 hours. Do no pull skin of face to avoid manipulation of fillers.

## 2021-03-19 NOTE — Progress Notes (Signed)
Patient presents to discuss nonsurgical facial rejuvenation.  She is bothered by her nasolabial folds and infraorbital hollows.  We discussed filler injection.  We discussed the risks and benefits of filler injection that include bleeding, infection, damage to surrounding structures need for additional procedures.  She read through the injectable consent form and had an opportunity to ask questions.  I recommended Restylane L for the infraorbital hollows and Restylane define for the nasolabial folds.  Face was prepped with alcohol pad and 1 cc of Restylane L was distributed in the infraorbital hollows followed by 1 cc of Restylane define in the nasolabial fold.  This gave a nice on table result.  I have asked her to call us in the next couple weeks if she has any concerns or would like any touchups.  All of her questions were answered plan to see her at her next visit.

## 2021-03-19 NOTE — Telephone Encounter (Signed)
Patient called to say that she received filler today and would like to have some post-care instructions for her eyes and smile area.  Please call.

## 2021-03-23 ENCOUNTER — Telehealth: Payer: Self-pay

## 2021-03-23 NOTE — Telephone Encounter (Signed)
Patient called to say that she has a bruise on her left cheek inside the skin where the needles went in.  She said it looks like the two needles went in close together and now there is a mark on her cheek.  Patient said it is swollen and sore.  She would like to know if the mark will go away.  She would also like to know how to treat it.  Patient would like to know how long it will take to see the full results.  Please call.

## 2021-03-23 NOTE — Telephone Encounter (Signed)
Returned patients call. She inquired about the marks/bruising on top and below the surface of skin on the left cheek that measured one inch long where the fillers were injected. There is also some bruising on the right side of her nose that has been improving over the last few days and was not that concern of this area. She also questioned how long will it take for the fillers to be noticeable and when will they disappear. Advised patient is it normal to have some bruising after having filler injections due to the disturbance of small blood vessels breaking from the needle and can take over a week to heal/disappear. Everyone is different how long the filler will show full effect along with dissipating. It can take up to 1-4 weeks to see a full effect and filler to integrate into the tissue. Most fillers last up to 9-12 months .  Both depends on their lifestyle, genetics and metabolism. Offered to make a follow up appointment if she needs, or like to have more injections. Patient understood, agreed with plan, but declined offer for follow up appointment.

## 2021-03-31 DIAGNOSIS — Z961 Presence of intraocular lens: Secondary | ICD-10-CM | POA: Insufficient documentation

## 2021-03-31 DIAGNOSIS — H209 Unspecified iridocyclitis: Secondary | ICD-10-CM | POA: Insufficient documentation

## 2021-03-31 DIAGNOSIS — E118 Type 2 diabetes mellitus with unspecified complications: Secondary | ICD-10-CM | POA: Insufficient documentation

## 2021-03-31 DIAGNOSIS — H35372 Puckering of macula, left eye: Secondary | ICD-10-CM | POA: Insufficient documentation

## 2021-05-29 ENCOUNTER — Encounter: Payer: Medicaid Other | Admitting: Plastic Surgery

## 2021-05-29 ENCOUNTER — Other Ambulatory Visit: Payer: Self-pay

## 2021-05-29 ENCOUNTER — Ambulatory Visit
Admission: EM | Admit: 2021-05-29 | Discharge: 2021-05-29 | Disposition: A | Discharge status: Home / Self Care | Payer: Medicaid Other | Attending: Urgent Care | Admitting: Urgent Care

## 2021-05-29 DIAGNOSIS — R0789 Other chest pain: Secondary | ICD-10-CM | POA: Diagnosis not present

## 2021-05-29 DIAGNOSIS — E119 Type 2 diabetes mellitus without complications: Secondary | ICD-10-CM

## 2021-05-29 DIAGNOSIS — Z794 Long term (current) use of insulin: Secondary | ICD-10-CM

## 2021-05-29 DIAGNOSIS — E1169 Type 2 diabetes mellitus with other specified complication: Secondary | ICD-10-CM | POA: Diagnosis not present

## 2021-05-29 DIAGNOSIS — E785 Hyperlipidemia, unspecified: Secondary | ICD-10-CM | POA: Diagnosis not present

## 2021-05-29 DIAGNOSIS — I10 Essential (primary) hypertension: Secondary | ICD-10-CM

## 2021-05-29 NOTE — ED Provider Notes (Signed)
[facility]   MRN: [id number] DOB: Nov 05, 1950  Subjective:   Kim Gibson is a 70 y.o. female presenting for acute onset today of persistent constant mid to left-sided chest pain rated 6 out of 10.  Patient has a history of diabetes, heart disease, essential hypertension, hyperlipidemia.  No current facility-administered medications for this encounter.  Current Outpatient Medications:    albuterol (PROAIR HFA) 108 (90 Base) MCG/ACT inhaler, Inhale 2 puffs into the lungs 4 (four) times daily as needed for wheezing or shortness of breath. , Disp: , Rfl:    ascorbic acid (VITAMIN C) 1000 MG tablet, Take 1,000 mg by mouth daily., Disp: , Rfl:    aspirin EC 81 MG tablet, Take 81 mg by mouth at bedtime. , Disp: , Rfl:    atenolol (TENORMIN) 50 MG tablet, Take 50 mg by mouth daily., Disp: , Rfl:    clonazePAM (KLONOPIN) 2 MG tablet, Take 2 mg by mouth at bedtime., Disp: , Rfl:    Coenzyme Q10 (CO Q-10) 120 MG CAPS, Take 120 mg by mouth daily., Disp: , Rfl:    Dapagliflozin-metFORMIN HCl ER (XIGDUO XR) 01-999 MG TB24, Take 1 tablet by mouth in the morning and at bedtime., Disp: , Rfl:    doxycycline (VIBRAMYCIN) 100 MG capsule, Take 1 capsule (100 mg total) by mouth 2 (two) times daily., Disp: 14 capsule, Rfl: 0   empagliflozin (JARDIANCE) 10 MG TABS tablet, Take by mouth daily., Disp: , Rfl:    enalapril (VASOTEC) 10 MG tablet, Take 10 mg by mouth 2 (two) times daily., Disp: , Rfl:    ezetimibe-simvastatin (VYTORIN) 10-40 MG tablet, Take 1 tablet by mouth at bedtime., Disp: , Rfl:    Garlic (GARLIQUE) 400 MG TBEC, Take 400 mg by mouth daily., Disp: , Rfl:    hydrOXYzine (ATARAX/VISTARIL) 25 MG tablet, Take 25 mg by mouth at bedtime as needed for itching. , Disp: , Rfl:    insulin glargine (LANTUS SOLOSTAR) 100 UNIT/ML Solostar Pen, Inject 32 Units into the skin daily after breakfast., Disp: , Rfl:    metFORMIN (GLUCOPHAGE) 1000 MG tablet, Take 1,000 mg by mouth 2  (two) times daily with a meal., Disp: , Rfl:    NIFEdipine (PROCARDIA-XL/NIFEDICAL-XL) 30 MG 24 hr tablet, Take 30 mg by mouth in the morning and at bedtime., Disp: , Rfl:    Omega 3-6-9 Fatty Acids (OMEGA-3-6-9 PO), Take 1 capsule by mouth in the morning and at bedtime. , Disp: , Rfl:    omeprazole (PRILOSEC) 20 MG capsule, Take 20 mg by mouth daily as needed (for indigestion)., Disp: , Rfl:    ranolazine (RANEXA) 500 MG 12 hr tablet, Take 1 tablet (500 mg total) by mouth 2 (two) times daily., Disp: 60 tablet, Rfl: 3   vitamin E 400 UNIT capsule, Take 400 Units by mouth daily., Disp: , Rfl:    Allergies  Allergen Reactions   Tape Itching   Nitroglycerin Anxiety and Other (See Comments)    "Made me feel like I was going to faint- it knocked me out"    Past Medical History:  Diagnosis Date   Anginal pain (HCC) 02/10/2017   AT REST   Arthritis    Coronary artery disease    Diabetes mellitus without complication (HCC)    Hyperlipemia    Hypertension    Insomnia      Past Surgical History:  Procedure Laterality Date   CARDIAC CATHETERIZATION N/A 09/24/2016   Procedure: Left Heart Cath and Coronary  Angiography;  Surgeon: Orpah Cobb, MD;  Location: [facility];  Service: Cardiovascular;  Laterality: N/A;   CARDIOVASCULAR STRESS TEST  11/29/2019   CESAREAN SECTION     CORONARY ANGIOGRAM  2005    Family History  Problem Relation Age of Onset   Heart attack Mother 108   Heart attack Father 58   Heart disease Brother    Breast cancer Neg Hx     Social History   Tobacco Use   Smoking status: Never   Smokeless tobacco: Never  Vaping Use   Vaping Use: Never used  Substance Use Topics   Alcohol use: No   Drug use: No    ROS   Objective:   Vitals: BP 124/71 (BP Location: Left Arm)   Pulse 82   Temp 98.6 F (37 C) (Oral)   Resp 18   SpO2 97%   Physical Exam Constitutional:      General: She is not in acute distress.    Appearance: Normal appearance. She  is well-developed. She is not ill-appearing, toxic-appearing or diaphoretic.  HENT:     Head: Normocephalic and atraumatic.     Nose: Nose normal.     Mouth/Throat:     Mouth: Mucous membranes are moist.  Eyes:     Extraocular Movements: Extraocular movements intact.     Pupils: Pupils are equal, round, and reactive to light.  Cardiovascular:     Rate and Rhythm: Normal rate and regular rhythm.     Pulses: Normal pulses.     Heart sounds: Normal heart sounds. No murmur heard.   No friction rub. No gallop.  Pulmonary:     Effort: Pulmonary effort is normal. No respiratory distress.     Breath sounds: Normal breath sounds. No stridor. No wheezing, rhonchi or rales.  Skin:    General: Skin is warm and dry.     Findings: No rash.  Neurological:     Mental Status: She is alert and oriented to person, place, and time.  Psychiatric:        Mood and Affect: Mood normal.        Behavior: Behavior normal.        Thought Content: Thought content normal.    ED ECG REPORT   Date: 05/29/2021  EKG Time: 7:44 PM  Rate: 77 bpm  Rhythm: normal sinus rhythm,  normal EKG, normal sinus rhythm, nonspecific T wave change  Axis: Normal  Intervals:none  ST&T Change: T wave inversion lead V2  Narrative Interpretation: Sinus rhythm at 77 bpm with nonspecific T wave change in lead V2 compared to previous EKG from March of last year.    Assessment and Plan :   PDMP not reviewed this encounter.  1. Atypical chest pain   2. Type 2 diabetes mellitus treated with insulin (HCC)   3. Essential hypertension   4. Dyslipidemia     Patient has multiple risk factors for heart event.  There is no sign of ACS on her EKG right now but needs further heart work-up.  Discussed this with patient and she is agreeable to present to the emergency room.  We both agreed to defer transport by EMS as she does not have signs of active ACS on her EKG.   Wallis Bamberg, PA-C 05/29/21 2004

## 2021-05-29 NOTE — Discharge Instructions (Addendum)
Please report to the emergency room now as you will need further evaluation for your heart to rule out a heart attack given your severe chest pain and history of HTN, diabetes, heart disease.

## 2021-05-29 NOTE — ED Triage Notes (Signed)
Pt c/o 6/10 sharp pain to left chest.

## 2021-05-31 ENCOUNTER — Emergency Department (HOSPITAL_COMMUNITY)
Admission: EM | Admit: 2021-05-31 | Discharge: 2021-05-31 | Disposition: A | Discharge status: Home / Self Care | Payer: Medicare Other | Attending: Emergency Medicine | Admitting: Emergency Medicine

## 2021-05-31 ENCOUNTER — Encounter (HOSPITAL_COMMUNITY): Payer: Self-pay | Admitting: Emergency Medicine

## 2021-05-31 ENCOUNTER — Emergency Department (HOSPITAL_COMMUNITY): Payer: Medicare Other

## 2021-05-31 DIAGNOSIS — I10 Essential (primary) hypertension: Secondary | ICD-10-CM | POA: Insufficient documentation

## 2021-05-31 DIAGNOSIS — I2510 Atherosclerotic heart disease of native coronary artery without angina pectoris: Secondary | ICD-10-CM | POA: Diagnosis not present

## 2021-05-31 DIAGNOSIS — Z79899 Other long term (current) drug therapy: Secondary | ICD-10-CM | POA: Diagnosis not present

## 2021-05-31 DIAGNOSIS — Z7984 Long term (current) use of oral hypoglycemic drugs: Secondary | ICD-10-CM | POA: Diagnosis not present

## 2021-05-31 DIAGNOSIS — R072 Precordial pain: Secondary | ICD-10-CM | POA: Diagnosis present

## 2021-05-31 DIAGNOSIS — E119 Type 2 diabetes mellitus without complications: Secondary | ICD-10-CM | POA: Diagnosis not present

## 2021-05-31 DIAGNOSIS — Z794 Long term (current) use of insulin: Secondary | ICD-10-CM | POA: Insufficient documentation

## 2021-05-31 DIAGNOSIS — Z7982 Long term (current) use of aspirin: Secondary | ICD-10-CM | POA: Insufficient documentation

## 2021-05-31 DIAGNOSIS — N644 Mastodynia: Secondary | ICD-10-CM | POA: Insufficient documentation

## 2021-05-31 LAB — BASIC METABOLIC PANEL
Anion gap: 9 (ref 5–15)
BUN: 17 mg/dL (ref 8–23)
CO2: 26 mmol/L (ref 22–32)
Calcium: 9.5 mg/dL (ref 8.9–10.3)
Chloride: 100 mmol/L (ref 98–111)
Creatinine, Ser: 0.82 mg/dL (ref 0.44–1.00)
GFR, Estimated: >60 mL/min (ref >60)
Glucose, Bld: 153 mg/dL — ABNORMAL HIGH (ref 70–99)
Potassium: 3.9 mmol/L (ref 3.5–5.1)
Sodium: 135 mmol/L (ref 135–145)

## 2021-05-31 LAB — CBC
HCT: 42.8 % (ref 36.0–46.0)
Hemoglobin: 13.8 g/dL (ref 12.0–15.0)
MCH: 30.4 pg (ref 26.0–34.0)
MCHC: 32.2 g/dL (ref 30.0–36.0)
MCV: 94.3 fL (ref 80.0–100.0)
Platelets: 226 10*3/uL (ref 150–400)
RBC: 4.54 MIL/uL (ref 3.87–5.11)
RDW: 14.7 % (ref 11.5–15.5)
WBC: 5.5 10*3/uL (ref 4.0–10.5)
nRBC: 0 % (ref 0.0–0.2)

## 2021-05-31 LAB — TROPONIN I (HIGH SENSITIVITY)
Troponin I (High Sensitivity): 5 ng/L (ref <18)
Troponin I (High Sensitivity): 4 ng/L (ref <18)

## 2021-05-31 MED ORDER — ACETAMINOPHEN 325 MG PO TABS
650.0000 mg | ORAL_TABLET | Freq: Once | ORAL | Status: AC
  Administered 2021-05-31: 650 mg via ORAL
  Filled 2021-05-31: qty 2

## 2021-05-31 NOTE — Discharge Instructions (Addendum)
Thank you for allowing me to care for you today in the Emergency Department.   You can take 650 mg of Tylenol once every 6 hours to try and improve your pain.  You can apply a ice pack or heating pad for 15 to 20 minutes up to 3-4 times a day to areas that are sore.  Call to schedule a follow-up appointment for reevaluation with your cardiology team in the next week.  Return to the emergency department if you start having chest pain that is worse with exertion, accompanied by sweating, if you pass out, vomiting, pain that is uncontrollable, or other new, concerning symptoms

## 2021-05-31 NOTE — ED Notes (Signed)
E-signature pad unavailable at time of pt discharge. This RN discussed discharge materials with pt and answered all pt questions. Pt stated understanding of discharge material. ? ?

## 2021-05-31 NOTE — ED Provider Notes (Signed)
Emergency Medicine Provider Triage Evaluation Note  Kim Gibson , a 70 y.o. female  was evaluated in triage.  Pt complains of chest pain.  Patient reports that chest pain has been there over the last 3 days.  Chest pain resolved yesterday however came back yesterday evening and has been constant since then.  Chest pain is midsternal and radiates to left chest.  Patient describes pain as a pressure.  No aggravating or alleviating factors.  Patient denies any nausea, vomiting, shortness of breath, diaphoresis, abdominal pain, back pain, lightheadedness or syncope, leg swelling, palpitations.  Review of Systems  Positive: Chest pain Negative: nausea, vomiting, shortness of breath, diaphoresis, abdominal pain, back pain, lightheadedness or syncope, leg swelling, palpitations  Physical Exam  There were no vitals taken for this visit. Gen:   Awake, no distress   Resp:  Normal effort, lungs clear to auscultation bilaterally MSK:   Moves extremities without difficulty, no swelling or tenderness to bilateral lower extremities Other:  Abdomen soft, nondistended, nontender, no mass or pulsatile mass, no peritoneal signs.  +2 radial pulse bilaterally  Medical Decision Making  Medically screening exam initiated at 5:41 PM.  Appropriate orders placed.  Kim Gibson was informed that the remainder of the evaluation will be completed by another provider, this initial triage assessment does not replace that evaluation, and the importance of remaining in the ED until their evaluation is complete.  The patient appears stable so that the remainder of the work up may be completed by another provider.      Haskel Schroeder, PA-C 06/01/21 0014    Derwood Kaplan, MD 06/04/21 [phone]

## 2021-05-31 NOTE — ED Triage Notes (Signed)
Pt reports pain to center and L chest x 3 days.  Denies sob, nausea, and vomiting.  Seen at [facility] on 9/2 for same.

## 2021-05-31 NOTE — ED Notes (Addendum)
No answer in lobby.  Husband states someone took pt to back.  Trying to locate pt.

## 2021-05-31 NOTE — ED Provider Notes (Signed)
[facility] EMERGENCY DEPARTMENT Provider Note   CSN: [id number] Arrival date & time: 05/31/21  1706     History No chief complaint on file.   Kim Gibson is a 70 y.o. female with a history of CAD, diabetes mellitus, hypertension, hyperlipidemia who presents the emergency department with a chief complaint of chest pain.  The patient reports that she has been having intermittent episodes of chest pain for the last 3 days.  Although pain has been intermittent for the last 3 days, she does report recurrent episodes of similar pain over the last year.  Pain has been constant today, but waxing and waning in intensity.  Pain is sharp and located in the center of her chest and near her left breast.  Pain is nonradiating.  She is intermittently having pressure-like pain.  Pain is worse with lifting her arms above her head and other movements.  No other known aggravating or alleviating factors.  No shortness of breath, chest pain, dizziness, lightheadedness, jaw pain, arm pain, diaphoresis, palpitations, leg swelling, abdominal pain, nausea, vomiting, diarrhea, or headache.  She denies any recent new exercises, injuries, or trauma.  She took her home aspirin earlier today with no improvement in her symptoms.  She has been compliant with all of her home medications with no missed doses.  She is followed by cardiology at [facility].  The patient had a normal stress test on May 01, 2021. Reports that she had a cardiac catheterization 7 years ago that showed three-vessel disease with 35%, 35%, and 30% occlusion.  The history is provided by the patient and medical records. No language interpreter was used.      Past Medical History:  Diagnosis Date   Anginal pain (HCC) 02/10/2017   AT REST   Arthritis    Coronary artery disease    Diabetes mellitus without complication (HCC)    Hyperlipemia    Hypertension    Insomnia     Patient Active Problem List    Diagnosis Date Noted   Acute coronary syndrome (HCC) 11/28/2019   Chest pain 02/10/2017   Precordial chest pain 09/24/2016   Angina at rest [facility]) 07/30/2015   Dysphagia 07/30/2015   Heart palpitations 07/30/2015   Diabetes type 2, uncontrolled (HCC) 07/30/2015   HTN (hypertension) 07/30/2015   HLD (hyperlipidemia) 07/30/2015   Diabetes mellitus with complication [facility])     Past Surgical History:  Procedure Laterality Date   CARDIAC CATHETERIZATION N/A 09/24/2016   Procedure: Left Heart Cath and Coronary Angiography;  Surgeon: Orpah Cobb, MD;  Location: [facility] INVASIVE CV LAB;  Service: Cardiovascular;  Laterality: N/A;   CARDIOVASCULAR STRESS TEST  11/29/2019   CESAREAN SECTION     CORONARY ANGIOGRAM  2005     OB History   No obstetric history on file.     Family History  Problem Relation Age of Onset   Heart attack Mother 46   Heart attack Father 65   Heart disease Brother    Breast cancer Neg Hx     Social History   Tobacco Use   Smoking status: Never   Smokeless tobacco: Never  Vaping Use   Vaping Use: Never used  Substance Use Topics   Alcohol use: No   Drug use: No    Home Medications Prior to Admission medications   Medication Sig Start Date End Date Taking? Authorizing Provider  albuterol (PROAIR HFA) 108 (90 Base) MCG/ACT inhaler Inhale 2 puffs into the lungs 4 (four)  times daily as needed for wheezing or shortness of breath.  06/21/18   [provider]  ascorbic acid (VITAMIN C) 1000 MG tablet Take 1,000 mg by mouth daily.    [provider]  aspirin EC 81 MG tablet Take 81 mg by mouth at bedtime.     [provider]  atenolol (TENORMIN) 50 MG tablet Take 50 mg by mouth daily.    [provider]  clonazePAM (KLONOPIN) 2 MG tablet Take 2 mg by mouth at bedtime.    [provider]  Coenzyme Q10 (CO Q-10) 120 MG CAPS Take 120 mg by mouth daily.    [provider]  Dapagliflozin-metFORMIN HCl ER (XIGDUO  XR) 01-999 MG TB24 Take 1 tablet by mouth in the morning and at bedtime.    [provider]  doxycycline (VIBRAMYCIN) 100 MG capsule Take 1 capsule (100 mg total) by mouth 2 (two) times daily. 02/24/21   Mardella Layman, MD  empagliflozin (JARDIANCE) 10 MG TABS tablet Take by mouth daily.    [provider]  enalapril (VASOTEC) 10 MG tablet Take 10 mg by mouth 2 (two) times daily.    [provider]  ezetimibe-simvastatin (VYTORIN) 10-40 MG tablet Take 1 tablet by mouth at bedtime.    [provider]  Garlic (GARLIQUE) 400 MG TBEC Take 400 mg by mouth daily.    [provider]  hydrOXYzine (ATARAX/VISTARIL) 25 MG tablet Take 25 mg by mouth at bedtime as needed for itching.  05/14/18   [provider]  insulin glargine (LANTUS SOLOSTAR) 100 UNIT/ML Solostar Pen Inject 32 Units into the skin daily after breakfast. 11/29/19   Orpah Cobb, MD  metFORMIN (GLUCOPHAGE) 1000 MG tablet Take 1,000 mg by mouth 2 (two) times daily with a meal.    [provider]  NIFEdipine (PROCARDIA-XL/NIFEDICAL-XL) 30 MG 24 hr tablet Take 30 mg by mouth in the morning and at bedtime.    [provider]  Omega 3-6-9 Fatty Acids (OMEGA-3-6-9 PO) Take 1 capsule by mouth in the morning and at bedtime.     [provider]  omeprazole (PRILOSEC) 20 MG capsule Take 20 mg by mouth daily as needed (for indigestion).    [provider]  ranolazine (RANEXA) 500 MG 12 hr tablet Take 1 tablet (500 mg total) by mouth 2 (two) times daily. 02/11/17   Orpah Cobb, MD  vitamin E 400 UNIT capsule Take 400 Units by mouth daily.    [provider]    Allergies    Tape and Nitroglycerin  Review of Systems   Review of Systems  Constitutional:  Negative for activity change, chills, diaphoresis, fatigue and fever.  HENT:  Negative for congestion and sore throat.   Respiratory:  Negative for cough, chest tightness, shortness of breath and wheezing.    Cardiovascular:  Positive for chest pain. Negative for palpitations and leg swelling.  Gastrointestinal:  Negative for abdominal pain, constipation, diarrhea, nausea and vomiting.  Genitourinary:  Negative for dysuria.  Musculoskeletal:  Negative for arthralgias, back pain, joint swelling, myalgias and neck pain.  Skin:  Negative for rash and wound.  Allergic/Immunologic: Negative for immunocompromised state.  Neurological:  Negative for seizures, syncope, weakness, numbness and headaches.  Psychiatric/Behavioral:  Negative for confusion.    Physical Exam Updated Vital Signs BP (!) 148/72 (BP Location: Left Arm)   Pulse 72   Temp 98.7 F (37.1 C) (Oral)   Resp 15   SpO2 100%   Physical Exam Vitals  and nursing note reviewed.  Constitutional:      General: She is not in acute distress.    Appearance: She is not ill-appearing, toxic-appearing or diaphoretic.  HENT:     Head: Normocephalic.  Eyes:     Conjunctiva/sclera: Conjunctivae normal.  Cardiovascular:     Rate and Rhythm: Normal rate and regular rhythm.     Pulses: Normal pulses.     Heart sounds: Normal heart sounds. No murmur heard.   No friction rub. No gallop.  Pulmonary:     Effort: Pulmonary effort is normal. No respiratory distress.     Breath sounds: No stridor. No wheezing, rhonchi or rales.     Comments: Reproducible tenderness palpation to the chest wall, especially over the sternum and the left anterior ribs.  No crepitus or step-offs.  No erythema, rashes, warmth, swelling. Chest:     Chest wall: Tenderness present.  Abdominal:     General: There is no distension.     Palpations: Abdomen is soft. There is no mass.     Tenderness: There is no abdominal tenderness. There is no right CVA tenderness, left CVA tenderness, guarding or rebound.     Hernia: No hernia is present.  Musculoskeletal:     Cervical back: Neck supple.     Right lower leg: No edema.     Left lower leg: No edema.  Skin:    General:  Skin is warm.     Findings: No rash.  Neurological:     Mental Status: She is alert.  Psychiatric:        Behavior: Behavior normal.    ED Results / Procedures / Treatments   Labs (all labs ordered are listed, but only abnormal results are displayed) Labs Reviewed  BASIC METABOLIC PANEL - Abnormal; Notable for the following components:      Result Value   Glucose, Bld 153 (*)    All other components within normal limits  CBC  TROPONIN I (HIGH SENSITIVITY)  TROPONIN I (HIGH SENSITIVITY)    EKG EKG Interpretation  Date/Time:  Sunday May 31 2021 17:27:05 EDT Ventricular Rate:  78 PR Interval:  168 QRS Duration: 86 QT Interval:  370 QTC Calculation: 421 R Axis:   91 Text Interpretation: Normal sinus rhythm Rightward axis no acute ST/T changes similar to Sept 2 2022 Confirmed by Goldston, Scott ([id number]) on 05/31/2021 10:51:31 PM  Radiology DG Chest 2 View  Result Date: 05/31/2021 CLINICAL DATA:  Chest pain for 3 days. EXAM: CHEST - 2 VIEW COMPARISON:  Chest radiograph dated 02/10/2017. FINDINGS: The heart size is normal. Vascular calcifications are seen in the aortic arch. Both lungs are clear. Degenerative changes are seen in the spine. IMPRESSION: No active cardiopulmonary disease. Aortic Atherosclerosis (ICD10-I70.0). Electronically Signed   By: Tyler  Litton M.D.   On: 05/31/2021 18:13    Procedures Procedures   Medications Ordered in ED Medications  acetaminophen (TYLENOL) tablet 650 mg (650 mg Oral Given 05/31/21 2311)    ED Course  I have reviewed the triage vital signs and the nursing notes.  Pertinent labs & imaging results that were available during my care of the patient were reviewed by me and considered in my medical decision making (see chart for details).    MDM Rules/Calculators/A&P                           69  year old female with a history of CAD, diabetes mellitus, hypertension, hyperlipidemia who presents  emergency department with a chief  complaint of chest pain.  She is in having intermittent episodes over the last 3 days, but reports a history of episodes of similar pain over the last year.  She is having no other associated symptoms.  Vital signs are stable.  On exam, she is nontoxic-appearing and does have some reproducible chest wall pain on exam.  Pain is also reproducible when she lifts her arm above her head.  Labs and imaging of been reviewed and independently interpreted by me.  EKG with no changes from previous.  Delta troponin is not elevated.  No metabolic derangements.  Chest x-ray with no acute findings.  No other acute changes noted on her labs.  Pain seems very atypical for ACS given that is worse with movements and positional changes.  She did also have a normal stress test 1 month ago.  Suspect precordial pain.  Developed aortic dissection, PE, tension pneumothorax, esophageal rupture.  Tylenol given for pain control.  Recommended Tylenol and supportive care at home.  She will follow-up with her cardiologist next week for reevaluation.  ER return precautions given.  She is hemodynamically stable in no acute distress.  Safer discharge home with outpatient follow-up as discussed.  Final Clinical Impression(s) / ED Diagnoses Final diagnoses:  Precordial chest pain    Rx / DC Orders ED Discharge Orders     None        Arlee Santosuosso A, PA-C 05/31/21 2330    Pricilla LovelessGoldston, Scott, MD 06/01/21 2332

## 2021-06-30 ENCOUNTER — Encounter: Payer: Medicaid Other | Admitting: Plastic Surgery

## 2022-03-01 DIAGNOSIS — R0602 Shortness of breath: Secondary | ICD-10-CM | POA: Insufficient documentation

## 2022-05-14 IMAGING — CR DG CHEST 2V
2 series · 2 of 2 positions shown · non-contrast
Comparison: Chest radiograph dated 02/10/2017.

CLINICAL DATA: Chest pain for 3 days.

EXAM:
CHEST - 2 VIEW

[chest pa]
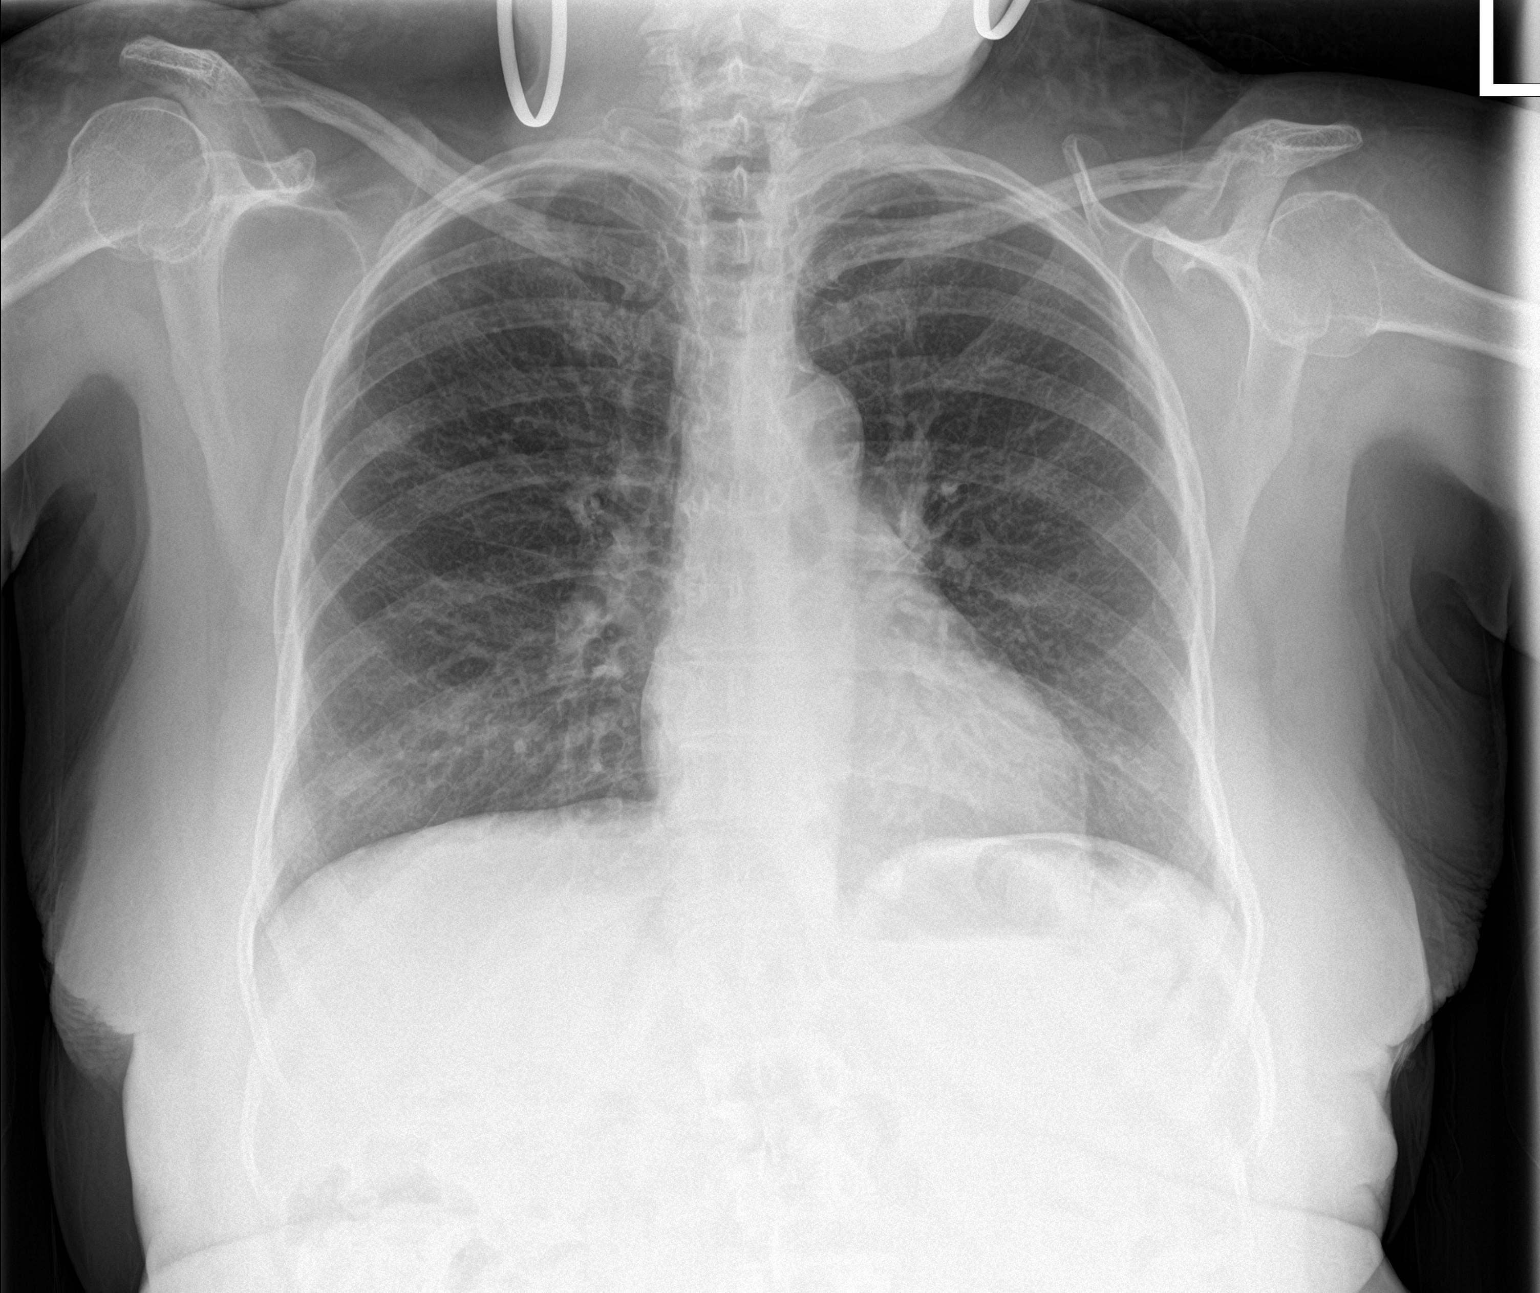

[chest lat]
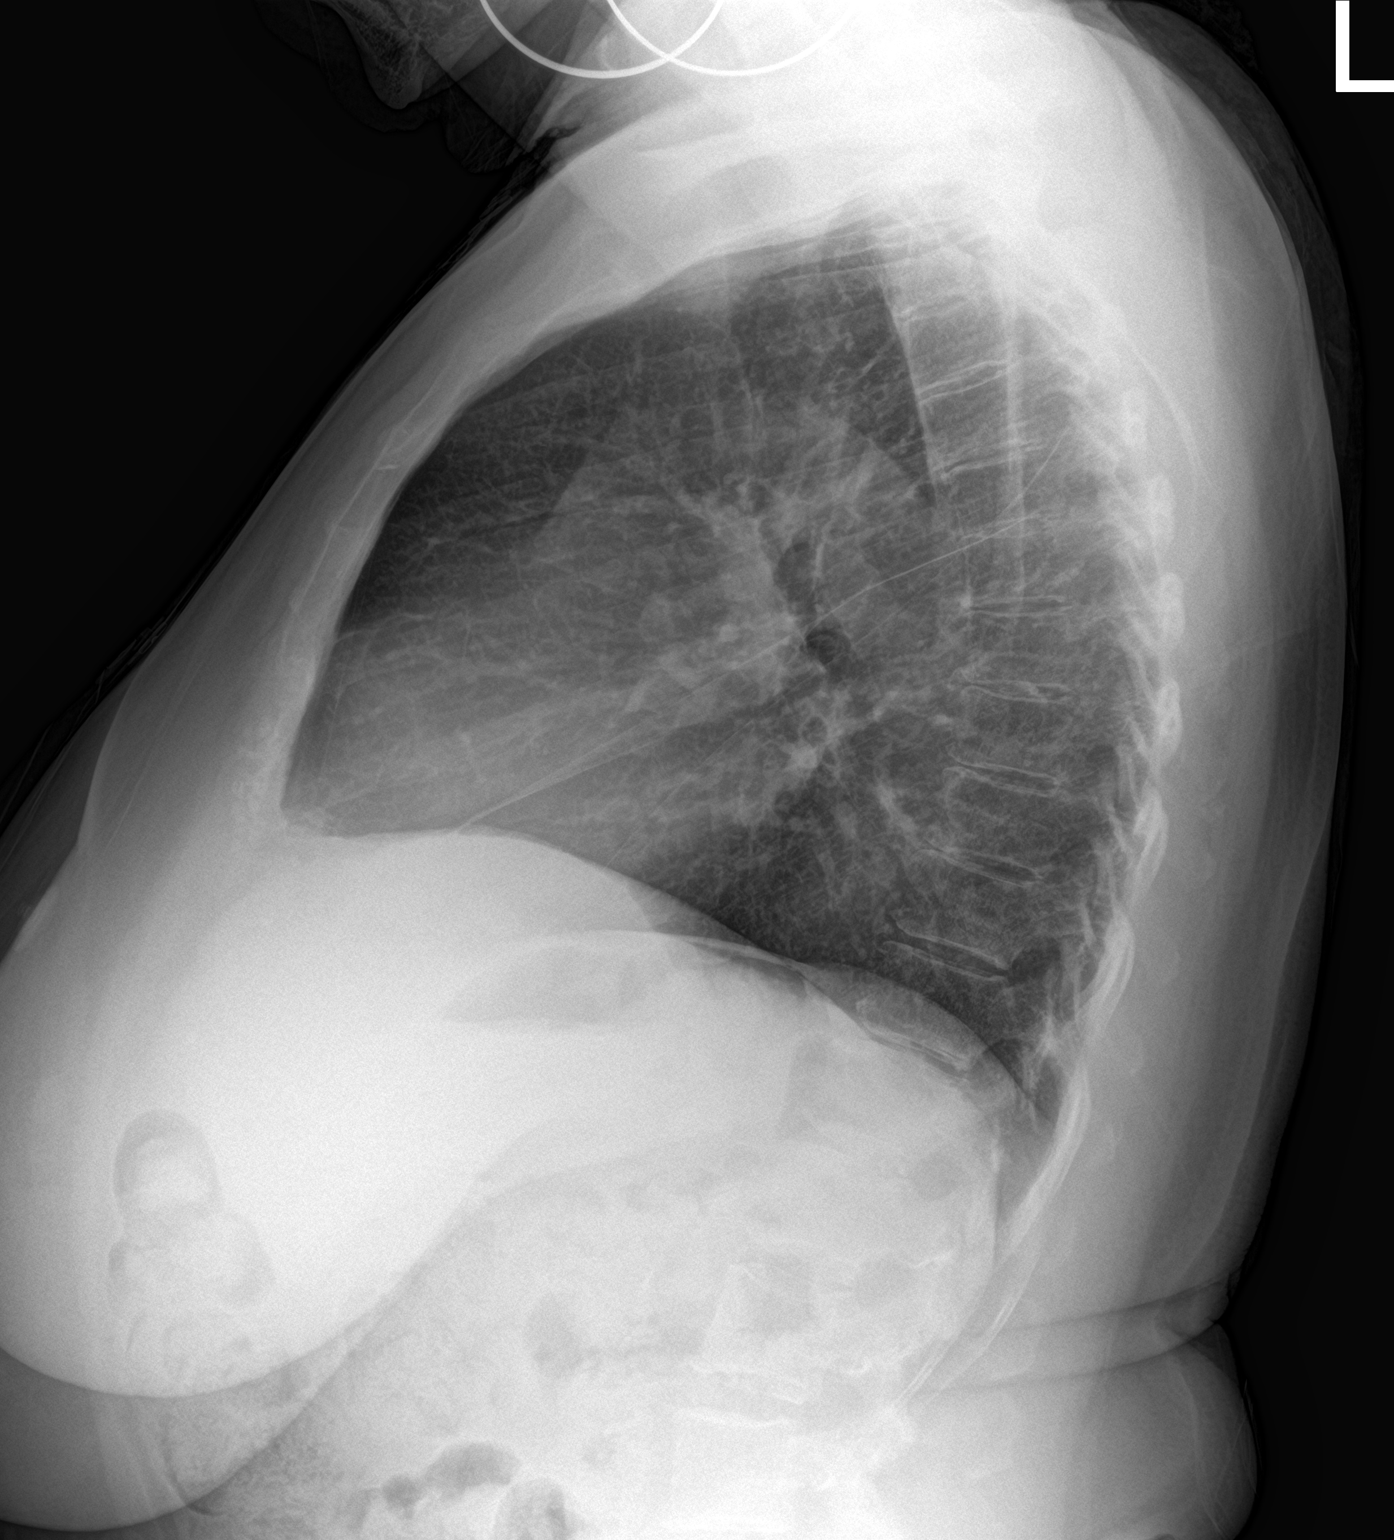

[2 of 2 positions shown; findings below may reference images not displayed]

FINDINGS: The heart size is normal. Vascular calcifications are seen in the
aortic arch. Both lungs are clear. Degenerative changes are seen in
the spine.
IMPRESSION: No active cardiopulmonary disease.

Aortic Atherosclerosis ([id number]).

## 2022-06-11 ENCOUNTER — Emergency Department (HOSPITAL_COMMUNITY)
Admission: EM | Admit: 2022-06-11 | Discharge: 2022-06-12 | Disposition: A | Discharge status: Home / Self Care | Payer: Medicare Other | Attending: Emergency Medicine | Admitting: Emergency Medicine

## 2022-06-11 ENCOUNTER — Emergency Department (HOSPITAL_COMMUNITY): Payer: Medicare Other

## 2022-06-11 ENCOUNTER — Encounter (HOSPITAL_COMMUNITY): Payer: Self-pay | Admitting: Emergency Medicine

## 2022-06-11 DIAGNOSIS — Z7982 Long term (current) use of aspirin: Secondary | ICD-10-CM | POA: Insufficient documentation

## 2022-06-11 DIAGNOSIS — E119 Type 2 diabetes mellitus without complications: Secondary | ICD-10-CM | POA: Insufficient documentation

## 2022-06-11 DIAGNOSIS — Z7984 Long term (current) use of oral hypoglycemic drugs: Secondary | ICD-10-CM | POA: Diagnosis not present

## 2022-06-11 DIAGNOSIS — R079 Chest pain, unspecified: Secondary | ICD-10-CM | POA: Diagnosis present

## 2022-06-11 DIAGNOSIS — I10 Essential (primary) hypertension: Secondary | ICD-10-CM | POA: Insufficient documentation

## 2022-06-11 DIAGNOSIS — I2510 Atherosclerotic heart disease of native coronary artery without angina pectoris: Secondary | ICD-10-CM | POA: Insufficient documentation

## 2022-06-11 DIAGNOSIS — Z79899 Other long term (current) drug therapy: Secondary | ICD-10-CM | POA: Diagnosis not present

## 2022-06-11 DIAGNOSIS — R0602 Shortness of breath: Secondary | ICD-10-CM | POA: Diagnosis not present

## 2022-06-11 LAB — BASIC METABOLIC PANEL
Anion gap: 11 (ref 5–15)
BUN: 14 mg/dL (ref 8–23)
CO2: 22 mmol/L (ref 22–32)
Calcium: 9.5 mg/dL (ref 8.9–10.3)
Chloride: 107 mmol/L (ref 98–111)
Creatinine, Ser: 0.85 mg/dL (ref 0.44–1.00)
GFR, Estimated: >60 mL/min (ref >60)
Glucose, Bld: 209 mg/dL — ABNORMAL HIGH (ref 70–99)
Potassium: 3.8 mmol/L (ref 3.5–5.1)
Sodium: 140 mmol/L (ref 135–145)

## 2022-06-11 LAB — CBC
HCT: 40.5 % (ref 36.0–46.0)
Hemoglobin: 13.3 g/dL (ref 12.0–15.0)
MCH: 30.6 pg (ref 26.0–34.0)
MCHC: 32.8 g/dL (ref 30.0–36.0)
MCV: 93.1 fL (ref 80.0–100.0)
Platelets: 177 10*3/uL (ref 150–400)
RBC: 4.35 MIL/uL (ref 3.87–5.11)
RDW: 13.5 % (ref 11.5–15.5)
WBC: 5.4 10*3/uL (ref 4.0–10.5)
nRBC: 0 % (ref 0.0–0.2)

## 2022-06-11 LAB — TROPONIN I (HIGH SENSITIVITY)
Troponin I (High Sensitivity): 6 ng/L (ref <18)
Troponin I (High Sensitivity): 7 ng/L (ref <18)

## 2022-06-11 LAB — BRAIN NATRIURETIC PEPTIDE: B Natriuretic Peptide: 48.5 pg/mL (ref 0.0–100.0)

## 2022-06-11 NOTE — ED Triage Notes (Signed)
Patient complains of chest pain that started approximately one week ago, is worse at night time and is described as a burning sensation in the center of the chest. Patient denies dizziness, denies nausea, reports intermittent shortness of breath.

## 2022-06-11 NOTE — ED Provider Triage Note (Signed)
Emergency Medicine Provider Triage Evaluation Note  Noreta Kue , a 71 y.o. female  was evaluated in triage.  Pt complains of chest pain and shortness of breath off-and-on for the past week.  Denies any nausea vomiting or lightheadedness.  Review of Systems  Positive:  Negative:   Physical Exam  BP (!) 150/75 (BP Location: Right Arm)   Pulse 76   Temp 99.1 F (37.3 C) (Oral)   Resp 18   SpO2 98%  Gen:   Awake, no distress, well-appearing Resp:  Normal effort  MSK:   Moves extremities without difficulty  Other:    Medical Decision Making  Medically screening exam initiated at 7:04 PM.  Appropriate orders placed.  Jeri Cos Tashonda Pinkus was informed that the remainder of the evaluation will be completed by another provider, this initial triage assessment does not replace that evaluation, and the importance of remaining in the ED until their evaluation is complete.  Patient well-appearing.  Will order chest pain work-up.   Achille Rich, [place] 06/11/22 [id number]

## 2022-06-12 ENCOUNTER — Emergency Department (HOSPITAL_COMMUNITY): Payer: Medicare Other

## 2022-06-12 DIAGNOSIS — R079 Chest pain, unspecified: Secondary | ICD-10-CM | POA: Diagnosis not present

## 2022-06-12 MED ORDER — ACETAMINOPHEN 325 MG PO TABS
650.0000 mg | ORAL_TABLET | Freq: Once | ORAL | Status: AC
  Administered 2022-06-12: 650 mg via ORAL
  Filled 2022-06-12: qty 2

## 2022-06-12 MED ORDER — ASPIRIN 325 MG PO TBEC
325.0000 mg | DELAYED_RELEASE_TABLET | Freq: Once | ORAL | Status: AC
  Administered 2022-06-12: 325 mg via ORAL
  Filled 2022-06-12: qty 1

## 2022-06-12 MED ORDER — IOHEXOL 350 MG/ML SOLN
100.0000 mL | Freq: Once | INTRAVENOUS | Status: AC | PRN
  Administered 2022-06-12: 100 mL via INTRAVENOUS

## 2022-06-12 NOTE — ED Notes (Signed)
Patient transported to CT 

## 2022-06-12 NOTE — Discharge Instructions (Addendum)
You were evaluated in the Emergency Department and after careful evaluation, we did not find any emergent condition requiring admission or further testing in the hospital.  Please make sure to follow-up with your primary care doctor to have a repeat CT scan in 3 months for the nodule seen on your CT scan in your lung today.  Please make sure to call Dr. Merrilee Jansky office on Monday for follow-up.  Please return to the Emergency Department if you experience any worsening of your condition. Thank you for allowing [place] to be a part of your care.

## 2022-06-12 NOTE — ED Provider Notes (Signed)
[facility] EMERGENCY DEPARTMENT Provider Note   CSN: [id number] Arrival date & time: 06/11/22  1732     History  Chief Complaint  Patient presents with   Chest Pain    Alajia Schmelzer Fynley Chrystal is a 71 y.o. female.  HPI 71 year old female with history of hypertension, insomnia, anginal pain, DM type II, hyperlipidemia, CAD presents to the ER with complaints of left-sided chest pain which radiates to the back which has been ongoing for the last 3 days.  Pain is burning in nature.  She is followed by Dr. Lynne Leader with cardiology.  She had a cardiac catheterization back in 2017, reviewed, 30% LAD occlusion and 35% second diag collision.  Reports she saw Dr. Algie Coffer, 3 weeks ago.  She denies any cough, fevers or chills.  Denies any nausea or vomiting.  No shortness of breath. Home Medications Prior to Admission medications   Medication Sig Start Date End Date Taking? Authorizing Provider  albuterol (PROAIR HFA) 108 (90 Base) MCG/ACT inhaler Inhale 2 puffs into the lungs 4 (four) times daily as needed for wheezing or shortness of breath.  06/21/18  Yes [provider]  ascorbic acid (VITAMIN C) 1000 MG tablet Take 1,000 mg by mouth daily.   Yes [provider]  aspirin EC 81 MG tablet Take 81 mg by mouth at bedtime.    Yes [provider]  atenolol (TENORMIN) 50 MG tablet Take 50 mg by mouth daily.   Yes [provider]  Coenzyme Q10 (CO Q-10) 120 MG CAPS Take 120 mg by mouth daily.   Yes [provider]  Doxepin HCl 6 MG TABS Take 6 mg by mouth at bedtime. 05/14/22  Yes [provider]  empagliflozin (JARDIANCE) 25 MG TABS tablet Take 25 mg by mouth daily.   Yes [provider]  enalapril (VASOTEC) 10 MG tablet Take 10 mg by mouth 2 (two) times daily.   Yes [provider]  esomeprazole (NEXIUM) 20 MG capsule Take 20 mg by mouth 2 (two) times daily. 04/20/22  Yes [provider]  ezetimibe  (ZETIA) 10 MG tablet Take 10 mg by mouth daily. 02/19/22  Yes [provider]  LEVEMIR FLEXPEN 100 UNIT/ML FlexPen Inject 30 Units into the skin daily. 06/08/22  Yes [provider]  metFORMIN (GLUCOPHAGE) 1000 MG tablet Take 1,000 mg by mouth 2 (two) times daily. 03/10/22  Yes [provider]  NIFEdipine (ADALAT CC) 30 MG 24 hr tablet Take 30 mg by mouth 2 (two) times daily. 06/08/22  Yes [provider]  Omega 3-6-9 Fatty Acids (OMEGA-3-6-9 PO) Take 1 capsule by mouth in the morning and at bedtime.    Yes [provider]  ranolazine (RANEXA) 500 MG 12 hr tablet Take 1 tablet (500 mg total) by mouth 2 (two) times daily. 02/11/17  Yes Orpah Cobb, MD  rosuvastatin (CRESTOR) 40 MG tablet Take 40 mg by mouth daily. 06/09/22  Yes [provider]  vitamin E 400 UNIT capsule Take 400 Units by mouth daily.   Yes [provider]  zolpidem (AMBIEN) 5 MG tablet Take 5 mg by mouth at bedtime as needed for sleep. 05/24/22  Yes [provider]      Allergies    Tape and Nitroglycerin    Review of Systems   Review of Systems Ten systems reviewed and are negative for acute change, except as noted in the HPI.   Physical Exam Updated Vital Signs BP (!) 132/94  Pulse 80   Temp 98.6 F (37 C) (Oral)   Resp 19   SpO2 100%  Physical Exam Vitals and nursing note reviewed.  Constitutional:      General: She is not in acute distress.    Appearance: She is well-developed.  HENT:     Head: Normocephalic and atraumatic.  Eyes:     Conjunctiva/sclera: Conjunctivae normal.  Cardiovascular:     Rate and Rhythm: Normal rate and regular rhythm.     Heart sounds: No murmur heard. Pulmonary:     Effort: Pulmonary effort is normal. No respiratory distress.     Breath sounds: Normal breath sounds.  Abdominal:     Palpations: Abdomen is soft.     Tenderness: There is no abdominal tenderness.  Musculoskeletal:        General: No swelling.      Cervical back: Neck supple.     Right lower leg: No edema.     Left lower leg: No edema.  Skin:    General: Skin is warm and dry.     Capillary Refill: Capillary refill takes less than 2 seconds.  Neurological:     Mental Status: She is alert.  Psychiatric:        Mood and Affect: Mood normal.     ED Results / Procedures / Treatments   Labs (all labs ordered are listed, but only abnormal results are displayed) Labs Reviewed  BASIC METABOLIC PANEL - Abnormal; Notable for the following components:      Result Value   Glucose, Bld 209 (*)    All other components within normal limits  CBC  BRAIN NATRIURETIC PEPTIDE  TROPONIN I (HIGH SENSITIVITY)  TROPONIN I (HIGH SENSITIVITY)    EKG EKG Interpretation  Date/Time:  Friday June 11 2022 17:56:31 EDT Ventricular Rate:  75 PR Interval:  164 QRS Duration: 76 QT Interval:  362 QTC Calculation: 404 R Axis:   74 Text Interpretation: Normal sinus rhythm Normal ECG When compared with ECG of 31-May-2021 17:27, PREVIOUS ECG IS PRESENT when compared to prior, similar appearance. NO STEMI Confirmed by Theda Belfastegeler, Chris ([id number]) on 06/12/2022 8:46:01 AM  Radiology CT Angio Chest/Abd/Pel for Dissection W and/or Wo Contrast  Result Date: 06/12/2022 CLINICAL DATA:  Chest pain, back pain EXAM: CT ANGIOGRAPHY CHEST, ABDOMEN AND PELVIS TECHNIQUE: Non-contrast CT of the chest was initially obtained. Multidetector CT imaging through the chest, abdomen and pelvis was performed using the standard protocol during bolus administration of intravenous contrast. Multiplanar reconstructed images and MIPs were obtained and reviewed to evaluate the vascular anatomy. RADIATION DOSE REDUCTION: This exam was performed according to the departmental dose-optimization program which includes automated exposure control, adjustment of the mA and/or kV according to patient size and/or use of iterative reconstruction technique. CONTRAST:  100mL OMNIPAQUE IOHEXOL 350  MG/ML SOLN COMPARISON:  Chest radiographs done on 06/11/2022 FINDINGS: CTA CHEST FINDINGS Cardiovascular: There is no demonstrable mural hematoma in the noncontrast images of the thoracic aorta. There is homogeneous enhancement in thoracic aorta. Scattered coronary artery calcifications are seen. Dense calcification is seen in the mitral annulus. There are no intraluminal filling defects in pulmonary artery branches. Mediastinum/Nodes: No significant lymphadenopathy is seen. Lungs/Pleura: There is no focal pulmonary consolidation. In image 26 of series 7, there is slightly lobulated linear opacity measuring 5 mm in thickness in right middle lobe inseparable from the major fissure. There is no pleural effusion or pneumothorax. Musculoskeletal: Unremarkable. Review of the MIP images confirms the above findings. CTA ABDOMEN AND  PELVIS FINDINGS VASCULAR Aorta: There are scattered calcifications in abdominal aorta without dissection or aneurysmal dilation. Celiac: No significant stenosis SMA: No significant stenosis. There is mild ectasia of proximal course of SMA measuring up to 9 mm in diameter. Renals: There are scattered calcifications without significant stenosis IMA: Patent. Inflow: Unremarkable. Veins: Unremarkable. Review of the MIP images confirms the above findings. NON-VASCULAR Hepatobiliary: No focal abnormalities are seen in liver. There is minimal nodularity in liver surface. There is no dilation of bile ducts. Gallbladder is unremarkable. Pancreas: No focal abnormalities are seen. Spleen: Unremarkable. Adrenals/Urinary Tract: Adrenals are unremarkable. There is no hydronephrosis. There are no renal or ureteral stones. Urinary bladder is unremarkable. Stomach/Bowel: Small hiatal hernia is seen. Stomach is not distended. Small bowel loops are not dilated. Appendix is not dilated. There is no significant wall thickening in colon. There is no pericolic stranding. Moderate amount of stool is seen in  rectosigmoid. Lymphatic: Unremarkable. Reproductive: Uterus is to the left of midline. No adnexal masses are seen. Other: There is no ascites or pneumoperitoneum. Small umbilical hernia containing fat is seen. Musculoskeletal: Unremarkable. Review of the MIP images confirms the above findings. IMPRESSION: There is no evidence of dissection in the thoracic and abdominal aorta. Major branches of thoracic and abdominal aorta are patent. There are few scattered coronary artery calcifications. There is no evidence of pulmonary artery embolism. There is a linear nodular density in right middle lobe inseparable from major fissure measuring 5 mm in thickness. This may suggest scarring or mucoid impaction in has small peripheral bronchus or active inflammatory process. Follow-up CT in 3 months may be considered. Small hiatal hernia. Other findings as described in the body of the report. Electronically Signed   By: Ernie Avena M.D.   On: 06/12/2022 11:13   DG Chest 2 View  Result Date: 06/11/2022 CLINICAL DATA:  Chest pain short of breath EXAM: CHEST - 2 VIEW COMPARISON:  05/31/2021 FINDINGS: The heart size and mediastinal contours are within normal limits. Aortic atherosclerosis. Both lungs are clear. The visualized skeletal structures are unremarkable. IMPRESSION: No active cardiopulmonary disease. Electronically Signed   By: Jasmine Pang M.D.   On: 06/11/2022 20:12    Procedures Procedures    Medications Ordered in ED Medications  iohexol (OMNIPAQUE) 350 MG/ML injection 100 mL (100 mLs Intravenous Contrast Given 06/12/22 1052)  acetaminophen (TYLENOL) tablet 650 mg (650 mg Oral Given 06/12/22 1226)  aspirin EC tablet 325 mg (325 mg Oral Given 06/12/22 1226)    ED Course/ Medical Decision Making/ A&P Clinical Course as of 06/12/22 1324  Sat Jun 12, 2022  [id number] 71 year old female presenting with chest pain radiating to the back.  Vitals overall reassuring.  Physical exam is benign. Differential  includes ACS, PE, dissection, pneumonia, GERD, stable versus unstable angina, anxiety  Labs ordered in triage, reviewed and interpreted by me.  CBC and BMP largely unremarkable, glucose of 209.  BNP normal.  Delta troponin negative.  KG reviewed, nonischemic.  Given the patient has chest pain radiating to the back, in the setting of her advanced age, will plan for CTA of the chest abdomen pelvis to rule out a dissection.  If this is normal, will consult cardiology for their recommendations. [MB]  1116 CT Angio Chest/Abd/Pel for Dissection W and/or Wo Contrast IMPRESSION: There is no evidence of dissection in the thoracic and abdominal aorta. Major branches of thoracic and abdominal aorta are patent. There are few scattered coronary artery calcifications. There is no evidence of pulmonary  artery embolism.  There is a linear nodular density in right middle lobe inseparable from major fissure measuring 5 mm in thickness. This may suggest scarring or mucoid impaction in has small peripheral bronchus or active inflammatory process. Follow-up CT in 3 months may be considered.  Small hiatal hernia. Other findings as described in the body of the report.   [MB]  1117 Cardiology paged [MB]  1321 Paged cardiology x3.  Attempted to call Dr. Merrilee Jansky office at [phone] as listed online, no answer. No one on call on Amion for his practice  [MB]  1322 Overall work-up reassuring.  No evidence of dissection.  Troponins are negative.  Patient's pain improved with aspirin and Tylenol.  I discussed the right lower lobe nodule seen on CT scan, she will follow-up with her primary care doctor for repeat CT scan in 3 months.  I recommended that she call Dr. Doylene Canard on Monday and schedule an appointment.  She is in agreements with this.  We discussed return precautions.  She voiced her standing and is agreeable.  Stable for discharge. [MB]    Clinical Course User Index [MB] Garald Balding, PA-C                            Medical Decision Making Amount and/or Complexity of Data Reviewed Labs: ordered. Radiology: ordered. Decision-making details documented in ED Course.  Risk OTC drugs. Prescription drug management.    Final Clinical Impression(s) / ED Diagnoses Final diagnoses:  Chest pain, unspecified type    Rx / DC Orders ED Discharge Orders     None         Garald Balding, PA-C 06/12/22 1324    Tegeler, Gwenyth Allegra, MD 06/12/22 [phone]

## 2023-01-05 ENCOUNTER — Ambulatory Visit: Payer: 59 | Admitting: Family

## 2023-02-02 DIAGNOSIS — G4709 Other insomnia: Secondary | ICD-10-CM | POA: Insufficient documentation

## 2023-02-18 ENCOUNTER — Other Ambulatory Visit: Payer: Self-pay | Admitting: Family

## 2023-02-18 DIAGNOSIS — Z1231 Encounter for screening mammogram for malignant neoplasm of breast: Secondary | ICD-10-CM

## 2023-03-07 ENCOUNTER — Telehealth (HOSPITAL_COMMUNITY): Payer: Self-pay | Admitting: Internal Medicine

## 2023-03-11 ENCOUNTER — Ambulatory Visit
Admission: RE | Admit: 2023-03-11 | Discharge: 2023-03-11 | Disposition: A | Discharge status: Home / Self Care | Payer: 59 | Source: Ambulatory Visit | Attending: Family | Admitting: Family

## 2023-03-11 DIAGNOSIS — Z1231 Encounter for screening mammogram for malignant neoplasm of breast: Secondary | ICD-10-CM

## 2023-03-17 NOTE — Progress Notes (Signed)
[facility] - Clinic Note  03/21/2023     CHIEF COMPLAINT Patient presents for Retina Evaluation   HISTORY OF PRESENT ILLNESS: Kim Gibson is a 72 y.o. female who presents to the clinic today for:   HPI     Retina Evaluation   In both eyes.  This started 1 month ago.  Duration of 1 month.  I, the attending physician,  performed the HPI with the patient and updated documentation appropriately.        Comments   Retina eval per Dr Melina Fiddler for diabetic eye exam pt is reporting some floaters in the left eye she has noticed some drooping in the left eye at times she noticed sometimes her vision is not as sharp depending on her blood sugar reading her last reading was 160 A1C unknown pt sees Dr Sherryll Burger for focal chorioretinal inflammation and is using loteprednol pt is wanting to have something done about her lids in the future       Last edited by Rennis Chris, MD on 03/21/2023 12:50 PM.    Pt is here on the referral of her PCP, Deneda Day, FNP, for a DM exam, pt had cataract sx with Dr. Nile Riggs several years ago, she states she had a problem with that sx and he sent her to see Dr. Sherryll Burger, she states she sees him once a year for CR inflammation, she was taking azathioprine, but was taken off earlier this month, her A1c was 7.4 on 04.30.24, pt states she has insomnia, which causes her left eye to droop, pt has an appt with Dr. Sondra Barges in July  Referring physician: Vladimir Crofts, FNP [id number] [place],  [place] [address]  HISTORICAL INFORMATION:   Selected notes from the MEDICAL RECORD NUMBER Referred by Baruch Goldmann, FNP (PCP) for diabetic eye exam LEE:  Ocular Hx- history CR inflammation managed by Dr. Sherryll Burger -- last visit Feb 2024; history of azathioprine use, but no longer using PMH-    CURRENT MEDICATIONS: No current outpatient medications on file. (Ophthalmic Drugs)   No current facility-administered medications for this visit.  (Ophthalmic Drugs)   Current Outpatient Medications (Other)  Medication Sig   albuterol (PROAIR HFA) 108 (90 Base) MCG/ACT inhaler Inhale 2 puffs into the lungs 4 (four) times daily as needed for wheezing or shortness of breath.    ascorbic acid (VITAMIN C) 1000 MG tablet Take 1,000 mg by mouth daily.   aspirin EC 81 MG tablet Take 81 mg by mouth at bedtime.    atenolol (TENORMIN) 50 MG tablet Take 50 mg by mouth daily.   Coenzyme Q10 (CO Q-10) 120 MG CAPS Take 120 mg by mouth daily.   Doxepin HCl 6 MG TABS Take 6 mg by mouth at bedtime.   empagliflozin (JARDIANCE) 25 MG TABS tablet Take 25 mg by mouth daily.   enalapril (VASOTEC) 10 MG tablet Take 10 mg by mouth 2 (two) times daily.   esomeprazole (NEXIUM) 20 MG capsule Take 20 mg by mouth 2 (two) times daily.   ezetimibe (ZETIA) 10 MG tablet Take 10 mg by mouth daily.   LEVEMIR FLEXPEN 100 UNIT/ML FlexPen Inject 30 Units into the skin daily.   metFORMIN (GLUCOPHAGE) 1000 MG tablet Take 1,000 mg by mouth 2 (two) times daily.   NIFEdipine (ADALAT CC) 30 MG 24 hr tablet Take 30 mg by mouth 2 (two) times daily.   Omega 3-6-9 Fatty Acids (OMEGA-3-6-9 PO) Take 1  capsule by mouth in the morning and at bedtime.    ranolazine (RANEXA) 500 MG 12 hr tablet Take 1 tablet (500 mg total) by mouth 2 (two) times daily.   rosuvastatin (CRESTOR) 40 MG tablet Take 40 mg by mouth daily.   vitamin E 400 UNIT capsule Take 400 Units by mouth daily.   zolpidem (AMBIEN) 5 MG tablet Take 5 mg by mouth at bedtime as needed for sleep.   No current facility-administered medications for this visit. (Other)   REVIEW OF SYSTEMS: ROS   Positive for: Endocrine, Eyes, Respiratory Last edited by Etheleen Mayhew, COT on 03/21/2023  8:44 AM.     ALLERGIES Allergies  Allergen Reactions   Tape Itching   Nitroglycerin Anxiety and Other (See Comments)    "Made me feel like I was going to faint- it knocked me out"   PAST MEDICAL HISTORY Past Medical History:   Diagnosis Date   Anginal pain (HCC) 02/10/2017   AT REST   Arthritis    Coronary artery disease    Diabetes mellitus without complication (HCC)    Hyperlipemia    Hypertension    Insomnia    Past Surgical History:  Procedure Laterality Date   CARDIAC CATHETERIZATION N/A 09/24/2016   Procedure: Left Heart Cath and Coronary Angiography;  Surgeon: Orpah Cobb, MD;  Location: [facility] INVASIVE CV LAB;  Service: Cardiovascular;  Laterality: N/A;   CARDIOVASCULAR STRESS TEST  11/29/2019   CESAREAN SECTION     CORONARY ANGIOGRAM  2005   FAMILY HISTORY Family History  Problem Relation Age of Onset   Heart attack Mother 73   Heart attack Father 23   Heart disease Brother    Breast cancer Neg Hx    SOCIAL HISTORY Social History   Tobacco Use   Smoking status: Never   Smokeless tobacco: Never  Vaping Use   Vaping Use: Never used  Substance Use Topics   Alcohol use: No   Drug use: No       OPHTHALMIC EXAM:  Base Eye Exam     Visual Acuity (Snellen - Linear)       Right Left   Dist cc 20/30 -1 20/25 -3   Dist ph cc 20/25 -3 NI    Correction: Glasses         Tonometry (Tonopen, 8:55 AM)       Right Left   Pressure 16 17         Pupils       Pupils Dark Light Shape React APD   Right PERRL 4 3 Round Brisk None   Left PERRL 4 3 Round Brisk None         Visual Fields       Left Right    Full Full         Extraocular Movement       Right Left    Full, Ortho Full, Ortho         Neuro/Psych     Oriented x3: Yes         Dilation     Both eyes: 2.5% Phenylephrine @ 8:56 AM           Slit Lamp and Fundus Exam     Slit Lamp Exam       Right Left   Lids/Lashes Dermatochalasis - upper lid, mild Ptosis Dermatochalasis - upper lid, Ptosis   Conjunctiva/Sclera Melanosis, inferior conjunctivochalasis Melanosis, inferior conjunctivochalasis   Cornea arcus, well healed cataract wound arcus, well  healed cataract wound   Anterior Chamber deep  and clear deep and clear   Iris Round and dilated Round and dilated   Lens PC IOL in good position PC IOL in good position   Anterior Vitreous Vitreous syneresis mild syneresis         Fundus Exam       Right Left   Disc Pink and Sharp, Compact, mild PPP Pink and Sharp, Compact, mild tilt, +PPP   C/D Ratio 0.2 0.5   Macula Flat, Good foveal reflex, RPE mottling, No heme or edema Flat, Good foveal reflex, RPE mottling, No heme or edema   Vessels attenuated, mild tortuosity attenuated, mild tortuosity   Periphery Attached, No heme Attached, No heme           Refraction     Wearing Rx       Sphere Cylinder Axis Add   Right -0.75 +0.25 164 +2.50   Left -1.00 +0.50 010 +2.50         Manifest Refraction   Unable to correct any better           IMAGING AND PROCEDURES  Imaging and Procedures for 03/21/2023  OCT, Retina - OU - Both Eyes       Right Eye Quality was good. Central Foveal Thickness: 312. Progression has no prior data. Findings include normal foveal contour, no SRF, intraretinal hyper-reflective material (Focal IRF/IRHM superior midzone caught on widefield).   Left Eye Quality was good. Central Foveal Thickness: 288. Progression has no prior data. Findings include normal foveal contour, no IRF, no SRF.   Notes *Images captured and stored on drive  Diagnosis / Impression:  OD: no DME / CME centrally -- Focal IRF/IRHM superior midzone caught on widefield OS: No DME / CME  Clinical management:  See below  Abbreviations: NFP - Normal foveal profile. CME - cystoid macular edema. PED - pigment epithelial detachment. IRF - intraretinal fluid. SRF - subretinal fluid. EZ - ellipsoid zone. ERM - epiretinal membrane. ORA - outer retinal atrophy. ORT - outer retinal tubulation. SRHM - subretinal hyper-reflective material. IRHM - intraretinal hyper-reflective material            ASSESSMENT/PLAN:    ICD-10-CM   1. Diabetes mellitus type 2 without  retinopathy (HCC)  E11.9 OCT, Retina - OU - Both Eyes    2. Long term (current) use of oral hypoglycemic drugs  Z79.84     3. Essential hypertension  I10     4. 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 Pseudophakia, both eyes  Z96.1      1.2. Diabetes mellitus, type 2 without retinopathy  - last A1c 7.4 on 04.30.24 - The incidence, risk factors for progression, natural history and treatment options for diabetic retinopathy  were discussed with patient.   - The need for close monitoring of blood glucose, blood pressure, and serum lipids, avoiding cigarette or any type of tobacco, and the need for long term follow up was also discussed with patient. - f/u in 1 year, sooner prn  3,4. Hypertensive retinopathy OU - discussed importance of tight BP control - monitor  5. Hx of chorioretinal inflammation OU  - managed by Dr. Sherryll Burger  - history of   - quiet today -- no active inflammation on exam  6. Pseudophakia OU  - s/p CE/IOL (Dr. Nile Riggs)  - IOL  in good position, doing well  - monitor  Ophthalmic Meds Ordered this visit:  No orders of the defined types were placed in this encounter.    Return in about 1 year (around 03/20/2024) for f/u DM exam, DFE, OCT.  There are no Patient Instructions on file for this visit.   Explained the diagnoses, plan, and follow up with the patient and they expressed understanding.  Patient expressed understanding of the importance of proper follow up care.   This document serves as a record of services personally performed by Karie Chimera, MD, PhD. It was created on their behalf by Annalee Genta, COMT. The creation of this record is the provider's dictation and/or activities during the visit.  Electronically signed by: Annalee Genta, COMT 03/21/23 12:53 PM  This document serves as a record of services personally performed by Karie Chimera, MD, PhD. It  was created on their behalf by Glee Arvin. Manson Passey, OA an ophthalmic technician. The creation of this record is the provider's dictation and/or activities during the visit.    Electronically signed by: Glee Arvin. Kristopher Oppenheim 06.24.2024 12:53 PM  Karie Chimera, M.D., Ph.D. Diseases & Surgery of the Retina and Vitreous [facility] 06.24.2024  I have reviewed the above documentation for accuracy and completeness, and I agree with the above. Karie Chimera, M.D., Ph.D. 03/21/23 12:53 PM  Abbreviations: M myopia (nearsighted); A astigmatism; H hyperopia (farsighted); P presbyopia; Mrx spectacle prescription;  CTL contact lenses; OD right eye; OS left eye; OU both eyes  XT exotropia; ET esotropia; PEK punctate epithelial keratitis; PEE punctate epithelial erosions; DES dry eye syndrome; MGD meibomian gland dysfunction; ATs artificial tears; PFAT's preservative free artificial tears; NSC nuclear sclerotic cataract; PSC posterior subcapsular cataract; ERM epi-retinal membrane; PVD posterior vitreous detachment; RD retinal detachment; DM diabetes mellitus; DR diabetic retinopathy; NPDR non-proliferative diabetic retinopathy; PDR proliferative diabetic retinopathy; CSME clinically significant macular edema; DME diabetic macular edema; dbh dot blot hemorrhages; CWS cotton wool spot; POAG primary open angle glaucoma; C/D cup-to-disc ratio; HVF humphrey visual field; GVF goldmann visual field; OCT optical coherence tomography; IOP intraocular pressure; BRVO Branch retinal vein occlusion; CRVO central retinal vein occlusion; CRAO central retinal artery occlusion; BRAO branch retinal artery occlusion; RT retinal tear; SB scleral buckle; PPV pars plana vitrectomy; VH Vitreous hemorrhage; PRP panretinal laser photocoagulation; IVK intravitreal kenalog; VMT vitreomacular traction; MH Macular hole;  NVD neovascularization of the disc; NVE neovascularization elsewhere; AREDS age related eye disease study;  ARMD age related macular degeneration; POAG primary open angle glaucoma; EBMD epithelial/anterior basement membrane dystrophy; ACIOL anterior chamber intraocular lens; IOL intraocular lens; PCIOL posterior chamber intraocular lens; Phaco/IOL phacoemulsification with intraocular lens placement; PRK photorefractive keratectomy; LASIK laser assisted in situ keratomileusis; HTN hypertension; DM diabetes mellitus; COPD chronic obstructive pulmonary disease

## 2023-03-21 ENCOUNTER — Ambulatory Visit (INDEPENDENT_AMBULATORY_CARE_PROVIDER_SITE_OTHER): Payer: 59 | Admitting: Ophthalmology

## 2023-03-21 ENCOUNTER — Encounter (INDEPENDENT_AMBULATORY_CARE_PROVIDER_SITE_OTHER): Payer: Self-pay | Admitting: Ophthalmology

## 2023-03-21 DIAGNOSIS — I10 Essential (primary) hypertension: Secondary | ICD-10-CM

## 2023-03-21 DIAGNOSIS — E119 Type 2 diabetes mellitus without complications: Secondary | ICD-10-CM | POA: Diagnosis not present

## 2023-03-21 DIAGNOSIS — Z7984 Long term (current) use of oral hypoglycemic drugs: Secondary | ICD-10-CM

## 2023-03-21 DIAGNOSIS — H35033 Hypertensive retinopathy, bilateral: Secondary | ICD-10-CM | POA: Diagnosis not present

## 2023-03-21 DIAGNOSIS — Z961 Presence of intraocular lens: Secondary | ICD-10-CM

## 2023-03-21 DIAGNOSIS — H3093 Unspecified chorioretinal inflammation, bilateral: Secondary | ICD-10-CM | POA: Diagnosis not present

## 2023-03-22 ENCOUNTER — Encounter (INDEPENDENT_AMBULATORY_CARE_PROVIDER_SITE_OTHER): Payer: 59 | Admitting: Ophthalmology

## 2023-03-28 ENCOUNTER — Encounter (HOSPITAL_COMMUNITY): Payer: 59 | Admitting: Psychiatry

## 2023-03-28 ENCOUNTER — Encounter (HOSPITAL_COMMUNITY): Payer: Self-pay

## 2023-03-29 NOTE — Progress Notes (Signed)
No paperwork for initial appointment.  

## 2023-04-04 ENCOUNTER — Encounter (HOSPITAL_COMMUNITY): Payer: Self-pay

## 2023-04-05 ENCOUNTER — Other Ambulatory Visit: Payer: Self-pay

## 2023-04-05 ENCOUNTER — Observation Stay (HOSPITAL_COMMUNITY)
Admission: RE | Admit: 2023-04-05 | Discharge: 2023-04-07 | Disposition: A | Discharge status: Home / Self Care | Payer: 59 | Source: Ambulatory Visit | Attending: Cardiovascular Disease | Admitting: Cardiovascular Disease

## 2023-04-05 ENCOUNTER — Encounter (HOSPITAL_COMMUNITY): Payer: Self-pay | Admitting: Cardiovascular Disease

## 2023-04-05 DIAGNOSIS — I25110 Atherosclerotic heart disease of native coronary artery with unstable angina pectoris: Secondary | ICD-10-CM | POA: Diagnosis present

## 2023-04-05 DIAGNOSIS — Z7984 Long term (current) use of oral hypoglycemic drugs: Secondary | ICD-10-CM | POA: Diagnosis not present

## 2023-04-05 DIAGNOSIS — E1165 Type 2 diabetes mellitus with hyperglycemia: Secondary | ICD-10-CM | POA: Insufficient documentation

## 2023-04-05 DIAGNOSIS — Z7982 Long term (current) use of aspirin: Secondary | ICD-10-CM | POA: Diagnosis not present

## 2023-04-05 DIAGNOSIS — Z79899 Other long term (current) drug therapy: Secondary | ICD-10-CM | POA: Insufficient documentation

## 2023-04-05 DIAGNOSIS — I10 Essential (primary) hypertension: Secondary | ICD-10-CM | POA: Diagnosis not present

## 2023-04-05 DIAGNOSIS — I249 Acute ischemic heart disease, unspecified: Principal | ICD-10-CM | POA: Diagnosis present

## 2023-04-05 LAB — COMPREHENSIVE METABOLIC PANEL
ALT: 22 U/L (ref 0–44)
AST: 18 U/L (ref 15–41)
Albumin: 3.6 g/dL (ref 3.5–5.0)
Alkaline Phosphatase: 47 U/L (ref 38–126)
Anion gap: 13 (ref 5–15)
BUN: 21 mg/dL (ref 8–23)
CO2: 26 mmol/L (ref 22–32)
Calcium: 9.3 mg/dL (ref 8.9–10.3)
Chloride: 103 mmol/L (ref 98–111)
Creatinine, Ser: 1.15 mg/dL — ABNORMAL HIGH (ref 0.44–1.00)
GFR, Estimated: 50 mL/min — ABNORMAL LOW (ref >60)
Glucose, Bld: 147 mg/dL — ABNORMAL HIGH (ref 70–99)
Potassium: 4.4 mmol/L (ref 3.5–5.1)
Sodium: 142 mmol/L (ref 135–145)
Total Bilirubin: 0.5 mg/dL (ref 0.3–1.2)
Total Protein: 6.3 g/dL — ABNORMAL LOW (ref 6.5–8.1)

## 2023-04-05 LAB — CBC WITH DIFFERENTIAL/PLATELET
Abs Immature Granulocytes: 0.02 10*3/uL (ref 0.00–0.07)
Basophils Absolute: 0 10*3/uL (ref 0.0–0.1)
Basophils Relative: 0 %
Eosinophils Absolute: 0.1 10*3/uL (ref 0.0–0.5)
Eosinophils Relative: 2 %
HCT: 38.8 % (ref 36.0–46.0)
Hemoglobin: 12.6 g/dL (ref 12.0–15.0)
Immature Granulocytes: 0 %
Lymphocytes Relative: 35 %
Lymphs Abs: 1.8 10*3/uL (ref 0.7–4.0)
MCH: 29.4 pg (ref 26.0–34.0)
MCHC: 32.5 g/dL (ref 30.0–36.0)
MCV: 90.4 fL (ref 80.0–100.0)
Monocytes Absolute: 0.5 10*3/uL (ref 0.1–1.0)
Monocytes Relative: 10 %
Neutro Abs: 2.8 10*3/uL (ref 1.7–7.7)
Neutrophils Relative %: 53 %
Platelets: 186 10*3/uL (ref 150–400)
RBC: 4.29 MIL/uL (ref 3.87–5.11)
RDW: 14.3 % (ref 11.5–15.5)
WBC: 5.2 10*3/uL (ref 4.0–10.5)
nRBC: 0 % (ref 0.0–0.2)

## 2023-04-05 LAB — TROPONIN I (HIGH SENSITIVITY)
Troponin I (High Sensitivity): 6 ng/L (ref <18)
Troponin I (High Sensitivity): 7 ng/L (ref <18)

## 2023-04-05 LAB — APTT: aPTT: 26 seconds (ref 24–36)

## 2023-04-05 LAB — PROTIME-INR
INR: 1 (ref 0.8–1.2)
Prothrombin Time: 13.2 seconds (ref 11.4–15.2)

## 2023-04-05 MED ORDER — ENALAPRIL MALEATE 10 MG PO TABS
10.0000 mg | ORAL_TABLET | Freq: Two times a day (BID) | ORAL | Status: DC
  Administered 2023-04-05 – 2023-04-07 (×4): 10 mg via ORAL
  Filled 2023-04-05 (×5): qty 1

## 2023-04-05 MED ORDER — RANOLAZINE ER 500 MG PO TB12
500.0000 mg | ORAL_TABLET | Freq: Two times a day (BID) | ORAL | Status: DC
  Administered 2023-04-05 – 2023-04-07 (×4): 500 mg via ORAL
  Filled 2023-04-05 (×4): qty 1

## 2023-04-05 MED ORDER — EZETIMIBE 10 MG PO TABS
10.0000 mg | ORAL_TABLET | Freq: Every day | ORAL | Status: DC
  Administered 2023-04-06 – 2023-04-07 (×2): 10 mg via ORAL
  Filled 2023-04-05 (×2): qty 1

## 2023-04-05 MED ORDER — ZOLPIDEM TARTRATE 5 MG PO TABS
5.0000 mg | ORAL_TABLET | Freq: Every evening | ORAL | Status: DC | PRN
  Administered 2023-04-05 – 2023-04-06 (×2): 5 mg via ORAL
  Filled 2023-04-05 (×2): qty 1

## 2023-04-05 MED ORDER — VITAMIN C 500 MG PO TABS
1000.0000 mg | ORAL_TABLET | Freq: Every day | ORAL | Status: DC
  Administered 2023-04-06 – 2023-04-07 (×2): 1000 mg via ORAL
  Filled 2023-04-05 (×2): qty 2

## 2023-04-05 MED ORDER — HEPARIN BOLUS VIA INFUSION
3500.0000 [IU] | Freq: Once | INTRAVENOUS | Status: AC
  Administered 2023-04-05: 3500 [IU] via INTRAVENOUS
  Filled 2023-04-05: qty 3500

## 2023-04-05 MED ORDER — ATENOLOL 25 MG PO TABS
50.0000 mg | ORAL_TABLET | Freq: Every day | ORAL | Status: DC
  Administered 2023-04-06 – 2023-04-07 (×2): 50 mg via ORAL
  Filled 2023-04-05 (×2): qty 2

## 2023-04-05 MED ORDER — HEPARIN (PORCINE) 25000 UT/250ML-% IV SOLN
750.0000 [IU]/h | INTRAVENOUS | Status: DC
  Administered 2023-04-05: 750 [IU]/h via INTRAVENOUS
  Filled 2023-04-05: qty 250

## 2023-04-05 MED ORDER — NITROGLYCERIN 0.4 MG SL SUBL: 0.4000 mg | SUBLINGUAL_TABLET | SUBLINGUAL | Status: DC | PRN

## 2023-04-05 MED ORDER — ASPIRIN 81 MG PO TBEC
81.0000 mg | DELAYED_RELEASE_TABLET | Freq: Every day | ORAL | Status: DC
  Administered 2023-04-06 – 2023-04-07 (×2): 81 mg via ORAL
  Filled 2023-04-05 (×2): qty 1

## 2023-04-05 MED ORDER — ACETAMINOPHEN 325 MG PO TABS: 650.0000 mg | ORAL_TABLET | ORAL | Status: DC | PRN

## 2023-04-05 MED ORDER — EMPAGLIFLOZIN 25 MG PO TABS
25.0000 mg | ORAL_TABLET | Freq: Every day | ORAL | Status: DC
  Administered 2023-04-06 – 2023-04-07 (×2): 25 mg via ORAL
  Filled 2023-04-05 (×2): qty 1

## 2023-04-05 MED ORDER — PANTOPRAZOLE SODIUM 40 MG PO TBEC
40.0000 mg | DELAYED_RELEASE_TABLET | Freq: Every day | ORAL | Status: DC
  Administered 2023-04-05 – 2023-04-07 (×3): 40 mg via ORAL
  Filled 2023-04-05 (×3): qty 1

## 2023-04-05 MED ORDER — CO Q-10 120 MG PO CAPS: 120.0000 mg | ORAL_CAPSULE | Freq: Every day | ORAL | Status: DC

## 2023-04-05 MED ORDER — ROSUVASTATIN CALCIUM 20 MG PO TABS
40.0000 mg | ORAL_TABLET | Freq: Every day | ORAL | Status: DC
  Administered 2023-04-05 – 2023-04-07 (×3): 40 mg via ORAL
  Filled 2023-04-05: qty 2
  Filled 2023-04-05: qty 8
  Filled 2023-04-05: qty 2

## 2023-04-05 MED ORDER — NIFEDIPINE ER OSMOTIC RELEASE 30 MG PO TB24
30.0000 mg | ORAL_TABLET | Freq: Two times a day (BID) | ORAL | Status: DC
  Administered 2023-04-05 – 2023-04-07 (×4): 30 mg via ORAL
  Filled 2023-04-05 (×5): qty 1

## 2023-04-05 MED ORDER — ALBUTEROL SULFATE (2.5 MG/3ML) 0.083% IN NEBU: 3.0000 mL | INHALATION_SOLUTION | Freq: Four times a day (QID) | RESPIRATORY_TRACT | Status: DC | PRN

## 2023-04-05 MED ORDER — SODIUM CHLORIDE 0.9 % IV SOLN: INTRAVENOUS | Status: DC

## 2023-04-05 MED ORDER — ONDANSETRON HCL 4 MG/2ML IJ SOLN: 4.0000 mg | Freq: Four times a day (QID) | INTRAMUSCULAR | Status: DC | PRN

## 2023-04-05 NOTE — Progress Notes (Signed)
PHARMACIST - PHYSICIAN ORDER COMMUNICATION  CONCERNING: P&T Medication Policy on Herbal Medications  DESCRIPTION:  This patient's order for:  Co Enzyme Q  has been noted.  This product(s) is classified as an "herbal" or natural product. Due to a lack of definitive safety studies or FDA approval, nonstandard manufacturing practices, plus the potential risk of unknown drug-drug interactions while on inpatient medications, the Pharmacy and Therapeutics Committee does not permit the use of "herbal" or natural products of this type within [facility].   ACTION TAKEN: The pharmacy department is unable to verify this order at this time and your patient has been informed of this safety policy. Please reevaluate patient's clinical condition at discharge and address if the herbal or natural product(s) should be resumed at that time.   Loralee Pacas, PharmD, BCPS 04/05/2023 8:50 PM

## 2023-04-05 NOTE — Progress Notes (Signed)
ANTICOAGULATION CONSULT NOTE - Initial Consult  Pharmacy Consult for Heparin Indication: chest pain/ACS  Allergies  Allergen Reactions   Tape Itching   Nitroglycerin Anxiety and Other (See Comments)    "Made me feel like I was going to faint- it knocked me out"    Patient Measurements:   64.6kg 62 inches Heparin Dosing Weight: 64 kg  Vital Signs: Temp: 98.4 F (36.9 C) (07/09 1733) Temp Source: Oral (07/09 1733) BP: 121/76 (07/09 1733) Pulse Rate: 77 (07/09 1733)  Labs: No results for input(s): "HGB", "HCT", "PLT", "APTT", "LABPROT", "INR", "HEPARINUNFRC", "HEPRLOWMOCWT", "CREATININE", "CKTOTAL", "CKMB", "TROPONINIHS" in the last 72 hours.  CrCl cannot be calculated (Patient's most recent lab result is older than the maximum 21 days allowed.).   Medical History: Past Medical History:  Diagnosis Date   Anginal pain (HCC) 02/10/2017   AT REST   Arthritis    Coronary artery disease    Diabetes mellitus without complication (HCC)    Hyperlipemia    Hypertension    Insomnia     Medications:  Scheduled:   [START ON 04/06/2023] aspirin EC  81 mg Oral Daily   Infusions:   sodium chloride     PRN: acetaminophen, ondansetron (ZOFRAN) IV  Assessment: 72 yo female who presents with chest pain x 3 months. Pharmacy consulted to dose IV heparin. Labs pending. No documented use of anticoagulants PTA.  Goal of Therapy:  Heparin level 0.3-0.7 units/ml Monitor platelets by anticoagulation protocol: Yes   Plan:  Give 3500 units bolus x 1 Start heparin infusion at 750 units/hr Check anti-Xa level in 8 hours and daily while on heparin Continue to monitor H&H and platelets  Loralee Pacas, PharmD, BCPS 04/05/2023,6:58 PM  Please check AMION for all [facility] Pharmacy phone numbers After 10:00 PM, call Main Pharmacy [phone]

## 2023-04-05 NOTE — H&P (Signed)
Referring Physician:  [name] is an 72 y.o. female.                       Chief Complaint: chest pain  HPI: 72 years old black female from [place] has recurrent retrosternal exertional chest pain x 3 months. She has PMH of type 2 DM, HTN, arthritis and HLD. She denies fever and cough.  Past Medical History:  Diagnosis Date   Anginal pain (HCC) 02/10/2017   AT REST   Arthritis    Coronary artery disease    Diabetes mellitus without complication (HCC)    Hyperlipemia    Hypertension    Insomnia       Past Surgical History:  Procedure Laterality Date   CARDIAC CATHETERIZATION N/A 09/24/2016   Procedure: Left Heart Cath and Coronary Angiography;  Surgeon: Orpah Cobb, MD;  Location: [facility] INVASIVE CV LAB;  Service: Cardiovascular;  Laterality: N/A;   CARDIOVASCULAR STRESS TEST  11/29/2019   CESAREAN SECTION     CORONARY ANGIOGRAM  2005    Family History  Problem Relation Age of Onset   Heart attack Mother 34   Heart attack Father 13   Heart disease Brother    Breast cancer Neg Hx    Social History:  reports that she has never smoked. She has never used smokeless tobacco. She reports that she does not drink alcohol and does not use drugs.  Allergies:  Allergies  Allergen Reactions   Tape Itching   Nitroglycerin Anxiety and Other (See Comments)    "Made me feel like I was going to faint- it knocked me out"    Medications Prior to Admission  Medication Sig Dispense Refill   albuterol (PROAIR HFA) 108 (90 Base) MCG/ACT inhaler Inhale 2 puffs into the lungs 4 (four) times daily as needed for wheezing or shortness of breath.      ascorbic acid (VITAMIN C) 1000 MG tablet Take 1,000 mg by mouth daily.     aspirin EC 81 MG tablet Take 81 mg by mouth at bedtime.      atenolol (TENORMIN) 50 MG tablet Take 50 mg by mouth daily.     Coenzyme Q10 (CO Q-10) 120 MG CAPS Take 120 mg by mouth daily.     Doxepin HCl 6 MG TABS Take 6 mg by mouth at bedtime.      empagliflozin (JARDIANCE) 25 MG TABS tablet Take 25 mg by mouth daily.     enalapril (VASOTEC) 10 MG tablet Take 10 mg by mouth 2 (two) times daily.     esomeprazole (NEXIUM) 20 MG capsule Take 20 mg by mouth 2 (two) times daily.     ezetimibe (ZETIA) 10 MG tablet Take 10 mg by mouth daily.     LEVEMIR FLEXPEN 100 UNIT/ML FlexPen Inject 30 Units into the skin daily.     metFORMIN (GLUCOPHAGE) 1000 MG tablet Take 1,000 mg by mouth 2 (two) times daily.     NIFEdipine (ADALAT CC) 30 MG 24 hr tablet Take 30 mg by mouth 2 (two) times daily.     Omega 3-6-9 Fatty Acids (OMEGA-3-6-9 PO) Take 1 capsule by mouth in the morning and at bedtime.      ranolazine (RANEXA) 500 MG 12 hr tablet Take 1 tablet (500 mg total) by mouth 2 (two) times daily. 60 tablet 3   rosuvastatin (CRESTOR) 40 MG tablet Take 40 mg by mouth daily.     vitamin E 400 UNIT capsule Take  400 Units by mouth daily.     zolpidem (AMBIEN) 5 MG tablet Take 5 mg by mouth at bedtime as needed for sleep.      No results found for this or any previous visit (from the past 48 hour(s)). No results found.  Review Of Systems Constitutional: No fever, chills, weight loss or gain. Eyes: No vision change, wears glasses. No discharge or pain. Ears: No hearing loss, No tinnitus. Respiratory: No asthma, COPD, pneumonias. Positive shortness of breath. No hemoptysis. Cardiovascular: Positive chest pain, palpitation, leg edema. Gastrointestinal: No nausea, vomiting, diarrhea, constipation. No GI bleed. No hepatitis. Genitourinary: No dysuria, hematuria, kidney stone. No incontinance. Neurological: No headache, stroke, seizures.  Psychiatry: No psych facility admission for anxiety, depression, suicide. No detox. Skin: No rash. Musculoskeletal: Positive joint pain, no fibromyalgia. No neck pain, back pain. Lymphadenopathy: No lymphadenopathy. Hematology: No anemia or easy bruising.   Blood pressure 121/76, pulse 77, temperature 98.4 F (36.9 C),  temperature source Oral, resp. rate 20, last menstrual period 05/29/2011, SpO2 98 %. There is no height or weight on file to calculate BMI. General appearance: alert, cooperative, appears stated age and mild respiratory distress Head: Normocephalic, atraumatic. Eyes: Brown eyes, pink conjunctiva, corneas clear.  Neck: No adenopathy, no carotid bruit, no JVD, supple, symmetrical, trachea midline and thyroid not enlarged. Resp: Clear to auscultation bilaterally. Cardio: Regular rate and rhythm, S1, S2 normal, II/VI systolic murmur, no click, rub or gallop GI: Soft, non-tender; bowel sounds normal; no organomegaly. Extremities: No edema, cyanosis or clubbing. Skin: Warm and dry.  Neurologic: Alert and oriented X 3, normal strength. Normal coordination and slow gait.  Assessment/Plan Acute coronary syndrome Type 2 DM CAD HTN HLD  Plan: IV heparin. Home meds. Cardiac cath in AM.  Time spent: Review of old records, Lab, x-rays, EKG, other cardiac tests, examination, discussion with patient/Nurse over 70 minutes.  Ricki Rodriguez, MD  04/05/2023, 6:38 PM

## 2023-04-05 NOTE — Plan of Care (Signed)

## 2023-04-05 NOTE — Progress Notes (Signed)
Patient is a direct admission from home. Attending MD notified of her arrival. VSS.

## 2023-04-06 ENCOUNTER — Observation Stay (HOSPITAL_COMMUNITY): Payer: 59

## 2023-04-06 ENCOUNTER — Encounter (HOSPITAL_COMMUNITY)
Admission: RE | Disposition: A | Discharge status: Home / Self Care | Payer: Self-pay | Source: Ambulatory Visit | Attending: Cardiovascular Disease

## 2023-04-06 DIAGNOSIS — I10 Essential (primary) hypertension: Secondary | ICD-10-CM | POA: Diagnosis not present

## 2023-04-06 DIAGNOSIS — Z79899 Other long term (current) drug therapy: Secondary | ICD-10-CM | POA: Diagnosis not present

## 2023-04-06 DIAGNOSIS — I25110 Atherosclerotic heart disease of native coronary artery with unstable angina pectoris: Secondary | ICD-10-CM | POA: Diagnosis not present

## 2023-04-06 DIAGNOSIS — E1165 Type 2 diabetes mellitus with hyperglycemia: Secondary | ICD-10-CM | POA: Diagnosis not present

## 2023-04-06 HISTORY — PX: LEFT HEART CATH AND CORONARY ANGIOGRAPHY: CATH118249

## 2023-04-06 LAB — CBC
HCT: 39.8 % (ref 36.0–46.0)
Hemoglobin: 13 g/dL (ref 12.0–15.0)
MCH: 29.2 pg (ref 26.0–34.0)
MCHC: 32.7 g/dL (ref 30.0–36.0)
MCV: 89.4 fL (ref 80.0–100.0)
Platelets: 182 10*3/uL (ref 150–400)
RBC: 4.45 MIL/uL (ref 3.87–5.11)
RDW: 14.1 % (ref 11.5–15.5)
WBC: 5 10*3/uL (ref 4.0–10.5)
nRBC: 0 % (ref 0.0–0.2)

## 2023-04-06 LAB — LIPID PANEL
Cholesterol: 179 mg/dL (ref 0–200)
HDL: 66 mg/dL (ref >40)
LDL Cholesterol: 99 mg/dL (ref 0–99)
Total CHOL/HDL Ratio: 2.7 RATIO
Triglycerides: 72 mg/dL (ref <150)
VLDL: 14 mg/dL (ref 0–40)

## 2023-04-06 LAB — BASIC METABOLIC PANEL
Anion gap: 9 (ref 5–15)
BUN: 15 mg/dL (ref 8–23)
CO2: 24 mmol/L (ref 22–32)
Calcium: 9 mg/dL (ref 8.9–10.3)
Chloride: 107 mmol/L (ref 98–111)
Creatinine, Ser: 0.81 mg/dL (ref 0.44–1.00)
GFR, Estimated: >60 mL/min (ref >60)
Glucose, Bld: 84 mg/dL (ref 70–99)
Potassium: 3.5 mmol/L (ref 3.5–5.1)
Sodium: 140 mmol/L (ref 135–145)

## 2023-04-06 LAB — GLUCOSE, CAPILLARY
Glucose-Capillary: 143 mg/dL — ABNORMAL HIGH (ref 70–99)
Glucose-Capillary: 70 mg/dL (ref 70–99)

## 2023-04-06 LAB — HEPARIN LEVEL (UNFRACTIONATED): Heparin Unfractionated: 0.35 IU/mL (ref 0.30–0.70)

## 2023-04-06 LAB — ECHOCARDIOGRAM COMPLETE: Height: 62 in

## 2023-04-06 LAB — POCT ACTIVATED CLOTTING TIME: Activated Clotting Time: 134 seconds

## 2023-04-06 SURGERY — LEFT HEART CATH AND CORONARY ANGIOGRAPHY
Anesthesia: LOCAL

## 2023-04-06 MED ORDER — FENTANYL CITRATE (PF) 100 MCG/2ML IJ SOLN
INTRAMUSCULAR | Status: DC | PRN
  Administered 2023-04-06: 25 ug via INTRAVENOUS

## 2023-04-06 MED ORDER — SODIUM CHLORIDE 0.9% FLUSH
3.0000 mL | Freq: Two times a day (BID) | INTRAVENOUS | Status: DC
  Administered 2023-04-07: 3 mL via INTRAVENOUS

## 2023-04-06 MED ORDER — FENTANYL CITRATE (PF) 100 MCG/2ML IJ SOLN
INTRAMUSCULAR | Status: AC
  Filled 2023-04-06: qty 2

## 2023-04-06 MED ORDER — LIDOCAINE HCL (PF) 1 % IJ SOLN
INTRAMUSCULAR | Status: AC
  Filled 2023-04-06: qty 30

## 2023-04-06 MED ORDER — IOHEXOL 350 MG/ML SOLN
INTRAVENOUS | Status: DC | PRN
  Administered 2023-04-06: 70 mL via INTRA_ARTERIAL

## 2023-04-06 MED ORDER — SODIUM CHLORIDE 0.9% FLUSH: 3.0000 mL | INTRAVENOUS | Status: DC | PRN

## 2023-04-06 MED ORDER — DEXTROSE 50 % IV SOLN
12.5000 g | INTRAVENOUS | Status: AC
  Administered 2023-04-06: 12.5 g via INTRAVENOUS
  Filled 2023-04-06: qty 50

## 2023-04-06 MED ORDER — MIDAZOLAM HCL 2 MG/2ML IJ SOLN
INTRAMUSCULAR | Status: DC | PRN
  Administered 2023-04-06: 1 mg via INTRAVENOUS

## 2023-04-06 MED ORDER — LIDOCAINE HCL (PF) 1 % IJ SOLN
INTRAMUSCULAR | Status: DC | PRN
  Administered 2023-04-06: 10 mL via INTRADERMAL

## 2023-04-06 MED ORDER — MIDAZOLAM HCL 2 MG/2ML IJ SOLN
INTRAMUSCULAR | Status: AC
  Filled 2023-04-06: qty 2

## 2023-04-06 MED ORDER — SODIUM CHLORIDE 0.9 % IV SOLN: INTRAVENOUS | Status: AC

## 2023-04-06 MED ORDER — HEPARIN (PORCINE) IN NACL 1000-0.9 UT/500ML-% IV SOLN
INTRAVENOUS | Status: DC | PRN
  Administered 2023-04-06 (×2): 500 mL

## 2023-04-06 MED ORDER — SODIUM CHLORIDE 0.9 % IV SOLN: 250.0000 mL | INTRAVENOUS | Status: DC | PRN

## 2023-04-06 SURGICAL SUPPLY — 9 items
CATH INFINITI 5FR MULTPACK ANG (CATHETERS) IMPLANT
CATH LAUNCHER 5F NOTO (CATHETERS) IMPLANT
CATHETER LAUNCHER 5F NOTO (CATHETERS) ×1
KIT HEART LEFT (KITS) ×1 IMPLANT
KIT MICROPUNCTURE NIT STIFF (SHEATH) IMPLANT
PACK CARDIAC CATHETERIZATION (CUSTOM PROCEDURE TRAY) ×1 IMPLANT
SHEATH PINNACLE 5F 10CM (SHEATH) IMPLANT
TRANSDUCER W/STOPCOCK (MISCELLANEOUS) ×1 IMPLANT
WIRE EMERALD 3MM-J .035X150CM (WIRE) IMPLANT

## 2023-04-06 NOTE — Progress Notes (Signed)
Hypoglycemic Event  CBG: 70  Treatment: D50 25 mL (12.5 gm)  Symptoms: Shaky and Hungry  Follow-up CBG: Time:1518 CBG Result:143  Possible Reasons for Event: Inadequate meal intake  Comments/MD notified: Dr. Algie Coffer notified. Patient taken to cath lab at this time.    Kim Gibson

## 2023-04-06 NOTE — Progress Notes (Signed)
Site area: rt groin 18f Site Prior to Removal:  Level 0 Pressure Applied For: 25 minutes Manual:   yes Patient Status During Pull:  stable Post Pull Site:  Level 0 Post Pull Instructions Given:  yes Post Pull Pulses Present: rt dp palpable Dressing Applied:  gauze and tegaderm Bedrest begins @ 1730 Comments:

## 2023-04-06 NOTE — Interval H&P Note (Signed)
History and Physical Interval Note:  04/06/2023 3:56 PM  Kim Gibson  has presented today for surgery, with the diagnosis of unstable angina.  The various methods of treatment have been discussed with the patient and family. After consideration of risks, benefits and other options for treatment, the patient has consented to  Procedure(s): LEFT HEART CATH AND CORONARY ANGIOGRAPHY (N/A) as a surgical intervention.  The patient's history has been reviewed, patient examined, no change in status, stable for surgery.  I have reviewed the patient's chart and labs.  Questions were answered to the patient's satisfaction.     Ricki Rodriguez

## 2023-04-06 NOTE — Progress Notes (Signed)
ANTICOAGULATION CONSULT NOTE - Follow up Consult  Pharmacy Consult for Heparin Indication: chest pain/ACS  Allergies  Allergen Reactions   Tape Itching   Nitroglycerin Anxiety and Other (See Comments)    "Made me feel like I was going to faint- it knocked me out"    Patient Measurements: Height: 5\' 2"  (157.5 cm) Weight: 64.2 kg (141 lb 8 oz) IBW/kg (Calculated) : 50.1 64.6kg 62 inches Heparin Dosing Weight: 64 kg  Vital Signs: Temp: 98.6 F (37 C) (07/10 0814) Temp Source: Oral (07/10 0814) BP: 144/82 (07/10 0814) Pulse Rate: 74 (07/10 0814)  Labs: Recent Labs    04/05/23 1847 04/05/23 2105 04/06/23 0642  HGB 12.6  --  13.0  HCT 38.8  --  39.8  PLT 186  --  182  APTT 26  --   --   LABPROT 13.2  --   --   INR 1.0  --   --   HEPARINUNFRC  --   --  0.35  CREATININE 1.15*  --  0.81  TROPONINIHS 6 7  --     Estimated Creatinine Clearance: 54.4 mL/min (by C-G formula based on SCr of 0.81 mg/dL).   Medical History: Past Medical History:  Diagnosis Date   Anginal pain (HCC) 02/10/2017   AT REST   Arthritis    Coronary artery disease    Diabetes mellitus without complication (HCC)    Hyperlipemia    Hypertension    Insomnia     Medications:  Scheduled:   ascorbic acid  1,000 mg Oral Daily   aspirin EC  81 mg Oral Daily   atenolol  50 mg Oral Daily   empagliflozin  25 mg Oral Daily   enalapril  10 mg Oral BID   ezetimibe  10 mg Oral Daily   NIFEdipine  30 mg Oral BID   pantoprazole  40 mg Oral Daily   ranolazine  500 mg Oral BID   rosuvastatin  40 mg Oral Daily   Infusions:   sodium chloride 30 mL/hr at 04/05/23 1957   heparin 750 Units/hr (04/05/23 2002)   PRN: acetaminophen, albuterol, ondansetron (ZOFRAN) IV, zolpidem  Assessment: 72 yo female who presents with chest pain x 3 months. Pharmacy consulted to dose IV heparin. Labs pending. No documented use of anticoagulants PTA. Heparin drip 750 uts/hr with heparin level 0.35 at goal  Follow up  after cath today   Goal of Therapy:  Heparin level 0.3-0.7 units/ml Monitor platelets by anticoagulation protocol: Yes   Plan:  Heparin drip 750 uts/hr Daily heparin level and cbc  Monitor s/s bleeding  F/u post cath today    Leota Sauers Pharm.D. CPP, BCPS Clinical Pharmacist [phone] 04/06/2023 10:59 AM    Please check AMION for all [facility] Pharmacy phone numbers After 10:00 PM, call Main Pharmacy [phone]

## 2023-04-06 NOTE — Progress Notes (Signed)
Echocardiogram 2D Echocardiogram has been performed.  Kim Gibson 04/06/2023, 2:47 PM

## 2023-04-06 NOTE — TOC CM/SW Note (Signed)
Transition of Care [facility]) - Inpatient Brief Assessment   Patient Details  Name: Kim Gibson MRN: [id number] Date of Birth: 1951/01/20  Transition of Care [facility]) CM/SW Contact:    Gala Lewandowsky, RN Phone Number: 04/06/2023, 10:19 AM   Clinical Narrative: Patient presented for chest pain-plan for LHC today. PTA patient was independent from home with spouse. Patient states she gets to appointments without any issues and gets her medications. No further needs identified at this time.    Transition of Care Asessment: Insurance and Status: Insurance coverage has been reviewed Patient has primary care physician: Yes Home environment has been reviewed: reviewed Prior level of function:: independent Prior/Current Home Services: No current home services Social Determinants of Health Reivew: SDOH reviewed no interventions necessary Readmission risk has been reviewed: Yes Transition of care needs: no transition of care needs at this time

## 2023-04-06 NOTE — Care Management Obs Status (Signed)
MEDICARE OBSERVATION STATUS NOTIFICATION   Patient Details  Name: Kim Gibson MRN: [id number] Date of Birth: Sep 12, 1951   Medicare Observation Status Notification Given:  Yes    Gala Lewandowsky, RN 04/06/2023, 10:18 AM

## 2023-04-07 ENCOUNTER — Encounter (HOSPITAL_COMMUNITY): Payer: Self-pay | Admitting: Cardiovascular Disease

## 2023-04-07 ENCOUNTER — Other Ambulatory Visit: Payer: Self-pay

## 2023-04-07 DIAGNOSIS — E1165 Type 2 diabetes mellitus with hyperglycemia: Secondary | ICD-10-CM | POA: Diagnosis not present

## 2023-04-07 DIAGNOSIS — Z79899 Other long term (current) drug therapy: Secondary | ICD-10-CM | POA: Diagnosis not present

## 2023-04-07 DIAGNOSIS — I25110 Atherosclerotic heart disease of native coronary artery with unstable angina pectoris: Secondary | ICD-10-CM | POA: Diagnosis not present

## 2023-04-07 DIAGNOSIS — I10 Essential (primary) hypertension: Secondary | ICD-10-CM | POA: Diagnosis not present

## 2023-04-07 LAB — CBC
HCT: 40.7 % (ref 36.0–46.0)
Hemoglobin: 13.7 g/dL (ref 12.0–15.0)
MCH: 30.7 pg (ref 26.0–34.0)
MCHC: 33.7 g/dL (ref 30.0–36.0)
MCV: 91.3 fL (ref 80.0–100.0)
Platelets: 203 10*3/uL (ref 150–400)
RBC: 4.46 MIL/uL (ref 3.87–5.11)
RDW: 14.2 % (ref 11.5–15.5)
WBC: 5.7 10*3/uL (ref 4.0–10.5)
nRBC: 0 % (ref 0.0–0.2)

## 2023-04-07 LAB — GLUCOSE, CAPILLARY
Glucose-Capillary: 179 mg/dL — ABNORMAL HIGH (ref 70–99)
Glucose-Capillary: 267 mg/dL — ABNORMAL HIGH (ref 70–99)
Glucose-Capillary: 198 mg/dL — ABNORMAL HIGH (ref 70–99)

## 2023-04-07 LAB — URINALYSIS, ROUTINE W REFLEX MICROSCOPIC
Bacteria, UA: NONE SEEN
Bilirubin Urine: NEGATIVE
Glucose, UA: >=500 mg/dL — AB
Hgb urine dipstick: NEGATIVE
Ketones, ur: NEGATIVE mg/dL
Leukocytes,Ua: NEGATIVE
Nitrite: NEGATIVE
Protein, ur: NEGATIVE mg/dL
Specific Gravity, Urine: 1.037 — ABNORMAL HIGH (ref 1.005–1.030)
pH: 5 (ref 5.0–8.0)

## 2023-04-07 LAB — ECHOCARDIOGRAM COMPLETE
AR max vel: 0.86 cm2
AV Area VTI: 0.81 cm2
AV Area mean vel: 0.83 cm2
AV Mean grad: 12.8 mmHg
AV Peak grad: 24.6 mmHg
Ao pk vel: 2.48 m/s
Area-P 1/2: 3.48 cm2
MV VTI: 1.32 cm2
S' Lateral: 2.8 cm
Weight: 2264 oz

## 2023-04-07 LAB — BASIC METABOLIC PANEL
Anion gap: 9 (ref 5–15)
BUN: 16 mg/dL (ref 8–23)
CO2: 26 mmol/L (ref 22–32)
Calcium: 9.4 mg/dL (ref 8.9–10.3)
Chloride: 102 mmol/L (ref 98–111)
Creatinine, Ser: 1.07 mg/dL — ABNORMAL HIGH (ref 0.44–1.00)
GFR, Estimated: 55 mL/min — ABNORMAL LOW (ref >60)
Glucose, Bld: 215 mg/dL — ABNORMAL HIGH (ref 70–99)
Potassium: 4.5 mmol/L (ref 3.5–5.1)
Sodium: 137 mmol/L (ref 135–145)

## 2023-04-07 LAB — LIPOPROTEIN A (LPA): Lipoprotein (a): 264.8 nmol/L — ABNORMAL HIGH (ref <75.0)

## 2023-04-07 MED ORDER — POLYVINYL ALCOHOL 1.4 % OP SOLN
1.0000 [drp] | Freq: Every day | OPHTHALMIC | Status: DC
  Filled 2023-04-07: qty 15

## 2023-04-07 MED ORDER — METFORMIN HCL 1000 MG PO TABS: 1000.0000 mg | ORAL_TABLET | Freq: Two times a day (BID) | ORAL | Status: AC

## 2023-04-07 MED ORDER — PROPYLENE GLYCOL 0.6 % OP SOLN
1.0000 [drp] | Freq: Every day | OPHTHALMIC | Status: DC
Start: 2023-04-07 — End: 2023-04-07

## 2023-04-07 NOTE — Inpatient Diabetes Management (Addendum)
Inpatient Diabetes Program Recommendations  AACE/ADA: New Consensus Statement on Inpatient Glycemic Control (2015)  Target Ranges:  Prepandial:   less than 140 mg/dL      Peak postprandial:   less than 180 mg/dL (1-2 hours)      Critically ill patients:  140 - 180 mg/dL     Latest Reference Range & Units 04/07/23 08:08 04/07/23 11:59  Glucose-Capillary 70 - 99 mg/dL 161 (H) 096 (H)  (H): Data is abnormally high   Home DM Meds: Levemir 30 units QAM      Jardiance 25 mg QAM      Metformin 1000 mg BID    Current DM Orders: Jardiance 25 mg Daily    MD- Please consider adding Novolog Moderate Correction Scale/ SSI (0-15 units) TID AC + HS     --Will follow patient during hospitalization--  Ambrose Finland RN, MSN, CDCES Diabetes Coordinator Inpatient Glycemic Control Team Team Pager: [phone] (8a-5p)

## 2023-04-07 NOTE — Plan of Care (Signed)

## 2023-04-07 NOTE — Progress Notes (Signed)
D/C instructions reviewed with patient, verbalized understanding. IV x2 removed. UA to be collected prior to discharge per Dr. Algie Coffer, patient verbalized understanding. To be transported to private vehicle via wheelchair.

## 2023-04-07 NOTE — Discharge Summary (Signed)
Physician Discharge Summary  Patient ID: Kim Gibson MRN: [id number] DOB/AGE: [birth year] 72 y.o.  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 She has PMH of type 2 DM, HTN, arthritis and HLD. She denies fever and cough.  Her EKG was normal. Troponin I was normal x 2, Lipoprotein (a) was elevated at 264.8 nmol/L  LDL was 99 mg. HDL was 66 mg. and Triglycerides was 72 mg. Cardiac cath showed mild LAD and LCx disease. Osteal LAD FFR was .94 Echocardiogram showed Normal LV systolic function with mild MR, TR and moderate AS. She was discharged home in stable condition with f/u by me in 1 week and primary care in 1 month.  Consults: cardiology  Significant Diagnostic Studies: labs: CBC was normal. BMET was near normal except elevated blood sugar as her insulin dose was not adjusted and Metformin was held and will be held additional 48 hours.  EKG was NSR.  Echocardiogram showed Normal LV systolic function with mild MR, TR and moderate AS.  Cardiac cath showed mild LAD and LCx disease. Osteal LAD FFR was 0.94  Treatments: cardiac meds: enalapril (Vasotec), atenolol, nifedipine, and Rosuvastatin with Ezetimibe  Discharge Exam: Blood pressure 115/70, pulse 70, temperature 98.7 F (37.1 C), temperature source Oral, resp. rate 18, height 5\' 2"  (1.575 m), weight 61.9 kg, last menstrual period 05/29/2011, SpO2 100%. General appearance: alert, cooperative and appears stated age. Head: Normocephalic, atraumatic. Eyes: Brown eyes, pink conjunctiva, corneas clear.    Neck: No adenopathy, no carotid bruit, no JVD, supple, symmetrical, trachea midline and thyroid not enlarged. Resp: Clear to auscultation bilaterally. Cardio: Regular rate and rhythm, S1, S2 normal, III/VI systolic murmur, no click, rub or gallop. GI: Soft, non-tender; bowel sounds normal; no organomegaly. Extremities: No edema, cyanosis or clubbing. Cath site in right groin was stable without bruise, swelling, pain or discharge. Skin: Warm and dry.  Neurologic: Alert and oriented X 3, normal strength and tone. Normal coordination and gait.  Disposition: Discharge disposition: 01-Home or Self Care        Allergies as of 04/07/2023       Reactions   Tape Itching   Nitroglycerin Anxiety, Other (See Comments)   "Made me feel like I was going to faint- it knocked me out"        Medication List     TAKE these medications    ascorbic acid 1000 MG tablet Commonly known as: VITAMIN C Take 1,000 mg by mouth 2 (two) times daily.   aspirin EC 81 MG tablet Take 81 mg by mouth at bedtime.   atenolol 50 MG tablet Commonly known as: TENORMIN Take 25 mg by mouth 2 (two) times daily.   Co Q-10 120 MG Caps Take 120 mg by mouth daily.   Doxepin HCl 6 MG Tabs Take 6 mg by mouth at bedtime.   empagliflozin 25 MG Tabs tablet Commonly known as: JARDIANCE Take 25 mg by mouth daily.   enalapril 10 MG tablet Commonly known as: VASOTEC Take 10 mg by mouth 2 (two) times daily.   esomeprazole 20 MG capsule Commonly  known as: NEXIUM Take 20 mg by mouth 2 (two) times daily.   ezetimibe 10 MG tablet Commonly known as: ZETIA Take 10 mg by mouth daily.   gabapentin 300 MG capsule Commonly known as: NEURONTIN Take 300 mg by mouth daily as needed (for nerve pain).   Levemir FlexPen 100 UNIT/ML FlexPen Generic drug: insulin detemir Inject 30 Units into the skin daily.   metFORMIN 1000 MG tablet Commonly known as: GLUCOPHAGE Take 1 tablet (1,000 mg total) by mouth 2 (two) times  daily. Starting on Saturday What changed: additional instructions   NIFEdipine 30 MG 24 hr tablet Commonly known as: ADALAT CC Take 30 mg by mouth 2 (two) times daily.   OMEGA-3-6-9 PO Take 1 capsule by mouth in the morning and at bedtime.   ProAir HFA 108 (90 Base) MCG/ACT inhaler Generic drug: albuterol Inhale 2 puffs into the lungs 4 (four) times daily as needed for wheezing or shortness of breath.   ranolazine 500 MG 12 hr tablet Commonly known as: RANEXA Take 1 tablet (500 mg total) by mouth 2 (two) times daily.   rosuvastatin 40 MG tablet Commonly known as: CRESTOR Take 40 mg by mouth daily.   SYSTANE BALANCE OP Place 1 drop into both eyes in the morning and at bedtime.   vitamin E 180 MG (400 UNITS) capsule Take 400 Units by mouth daily.   zolpidem 5 MG tablet Commonly known as: AMBIEN Take 5 mg by mouth at bedtime as needed for sleep.        Follow-up Information     Mitchell Heir, DO Follow up in 1 month(s).   Specialty: Internal Medicine Contact information: [address] [place] [address] [phone]         Orpah Cobb, MD Follow up in 1 week(s).   Specialty: Cardiology Contact information: [address] [place] [address] [phone]                 Time spent: Review of old chart, current chart, lab, x-ray, cardiac tests and discussion with patient over 60 minutes.  Signed: Ricki Rodriguez 04/07/2023, 5:23 PM

## 2023-05-09 ENCOUNTER — Ambulatory Visit (HOSPITAL_COMMUNITY): Payer: 59 | Admitting: Psychiatry

## 2023-05-31 DIAGNOSIS — I350 Nonrheumatic aortic (valve) stenosis: Secondary | ICD-10-CM | POA: Insufficient documentation

## 2023-05-31 DIAGNOSIS — I2510 Atherosclerotic heart disease of native coronary artery without angina pectoris: Secondary | ICD-10-CM | POA: Insufficient documentation

## 2023-07-05 NOTE — Therapy (Signed)
OUTPATIENT PHYSICAL THERAPY THORACOLUMBAR EVALUATION   Patient Name: Kim Gibson MRN: [id number] DOB:07-Feb-1951, 72 y.o., female Today's Date: 07/05/2023  END OF SESSION:   Past Medical History:  Diagnosis Date   Anginal pain (HCC) 02/10/2017   AT REST   Arthritis    Coronary artery disease    Diabetes mellitus without complication (HCC)    Hyperlipemia    Hypertension    Insomnia    Past Surgical History:  Procedure Laterality Date   CARDIAC CATHETERIZATION N/A 09/24/2016   Procedure: Left Heart Cath and Coronary Angiography;  Surgeon: Orpah Cobb, MD;  Location: [facility];  Service: Cardiovascular;  Laterality: N/A;   CARDIOVASCULAR STRESS TEST  11/29/2019   CESAREAN SECTION     CORONARY ANGIOGRAM  2005   LEFT HEART CATH AND CORONARY ANGIOGRAPHY N/A 04/06/2023   Procedure: LEFT HEART CATH AND CORONARY ANGIOGRAPHY;  Surgeon: Orpah Cobb, MD;  Location: [facility];  Service: Cardiovascular;  Laterality: N/A;   Patient Active Problem List   Diagnosis Date Noted   Acute coronary syndrome (HCC) 11/28/2019   Chest pain 02/10/2017   Precordial chest pain 09/24/2016   Angina at rest [facility]) 07/30/2015   Dysphagia 07/30/2015   Heart palpitations 07/30/2015   Diabetes type 2, uncontrolled 07/30/2015   HTN (hypertension) 07/30/2015   HLD (hyperlipidemia) 07/30/2015   Diabetes mellitus with complication (HCC)     PCP: Mitchell Heir, DO   REFERRING PROVIDER: Margart Sickles, PA-C   REFERRING DIAG: Thoraco-Lumbar Pain   Rationale for Evaluation and Treatment: Rehabilitation  THERAPY DIAG:  No diagnosis found.  ONSET DATE: ***  SUBJECTIVE:                                                                                                                                                                                           SUBJECTIVE STATEMENT: ***  PERTINENT HISTORY:  ***  PAIN:  Are you having pain?  {OPRCPAIN:27236}  PRECAUTIONS: {Therapy precautions:24002}  RED FLAGS: {PT Red Flags:29287}   WEIGHT BEARING RESTRICTIONS: {Yes ***/No:24003}  FALLS:  Has patient fallen in last 6 months? {fallsyesno:27318}  LIVING ENVIRONMENT: Lives with: {[facility] lives with:25569::"lives with their family"} Lives in: {Lives in:25570} Stairs: {opstairs:27293} Has following equipment at home: {Assistive devices:23999}  OCCUPATION: ***  PLOF: {PLOF:24004}  PATIENT GOALS: ***  NEXT MD VISIT: ***  OBJECTIVE:  Note: Objective measures were completed at Evaluation unless otherwise noted.  DIAGNOSTIC FINDINGS:  ***  PATIENT SURVEYS:  {rehab surveys:24030}  SCREENING FOR RED FLAGS: Bowel or bladder incontinence: {Yes/No:304960894} Spinal tumors: {Yes/No:304960894} Cauda equina syndrome: {Yes/No:304960894} Compression fracture: {Yes/No:304960894} Abdominal aneurysm: {Yes/No:304960894}  COGNITION:  Overall cognitive status: {cognition:24006}     SENSATION: {sensation:27233}  MUSCLE LENGTH: Hamstrings: Right *** deg; Left *** deg Maisie Fus test: Right *** deg; Left *** deg  POSTURE: {posture:25561}  PALPATION: ***  LUMBAR ROM:   AROM eval  Flexion   Extension   Right lateral flexion   Left lateral flexion   Right rotation   Left rotation    (Blank rows = not tested)  LOWER EXTREMITY ROM:     {AROM/PROM:27142}  Right eval Left eval  Hip flexion    Hip extension    Hip abduction    Hip adduction    Hip internal rotation    Hip external rotation    Knee flexion    Knee extension    Ankle dorsiflexion    Ankle plantarflexion    Ankle inversion    Ankle eversion     (Blank rows = not tested)  LOWER EXTREMITY MMT:    MMT Right eval Left eval  Hip flexion    Hip extension    Hip abduction    Hip adduction    Hip internal rotation    Hip external rotation    Knee flexion    Knee extension    Ankle dorsiflexion    Ankle plantarflexion    Ankle inversion     Ankle eversion     (Blank rows = not tested)  LUMBAR SPECIAL TESTS:  {lumbar special test:25242}  FUNCTIONAL TESTS:  {Functional tests:24029}  GAIT: Distance walked: *** Assistive device utilized: {Assistive devices:23999} Level of assistance: {Levels of assistance:24026} Comments: ***  TODAY'S TREATMENT:                                                                                                                              DATE: ***    PATIENT EDUCATION:  Education details: *** Person educated: {Person educated:25204} Education method: {Education Method:25205} Education comprehension: {Education Comprehension:25206}  HOME EXERCISE PROGRAM: ***  ASSESSMENT:  CLINICAL IMPRESSION: Patient is a *** y.o. *** who was seen today for physical therapy evaluation and treatment for ***.   OBJECTIVE IMPAIRMENTS: {opptimpairments:25111}.   ACTIVITY LIMITATIONS: {activitylimitations:27494}  PARTICIPATION LIMITATIONS: {participationrestrictions:25113}  PERSONAL FACTORS: {Personal factors:25162} are also affecting patient's functional outcome.   REHAB POTENTIAL: {rehabpotential:25112}  CLINICAL DECISION MAKING: {clinical decision making:25114}  EVALUATION COMPLEXITY: {Evaluation complexity:25115}   GOALS: Goals reviewed with patient? {yes/no:20286}  SHORT TERM GOALS: Target date: ***  *** Baseline: Goal status: INITIAL  2.  *** Baseline:  Goal status: INITIAL  3.  *** Baseline:  Goal status: INITIAL  4.  *** Baseline:  Goal status: INITIAL  5.  *** Baseline:  Goal status: INITIAL  6.  *** Baseline:  Goal status: INITIAL  LONG TERM GOALS: Target date: ***  *** Baseline:  Goal status: INITIAL  2.  *** Baseline:  Goal status: INITIAL  3.  *** Baseline:  Goal status: INITIAL  4.  *** Baseline:  Goal status: INITIAL  5.  ***  Baseline:  Goal status: INITIAL  6.  *** Baseline:  Goal status: INITIAL  PLAN:  PT FREQUENCY: {rehab  frequency:25116}  PT DURATION: {rehab duration:25117}  PLANNED INTERVENTIONS: {rehab planned interventions:25118::"Therapeutic exercises","Therapeutic activity","Neuromuscular re-education","Balance training","Gait training","Patient/Family education","Self Care","Joint mobilization"}.  PLAN FOR NEXT SESSION: Mauri Reading, PT 07/05/2023, 9:04 AM

## 2023-07-07 ENCOUNTER — Ambulatory Visit: Payer: 59 | Attending: Physician Assistant

## 2023-07-07 DIAGNOSIS — M546 Pain in thoracic spine: Secondary | ICD-10-CM | POA: Insufficient documentation

## 2023-07-07 DIAGNOSIS — M5459 Other low back pain: Secondary | ICD-10-CM | POA: Insufficient documentation

## 2023-07-13 ENCOUNTER — Ambulatory Visit: Payer: 59

## 2023-07-13 DIAGNOSIS — M546 Pain in thoracic spine: Secondary | ICD-10-CM

## 2023-07-13 DIAGNOSIS — M5459 Other low back pain: Secondary | ICD-10-CM

## 2023-07-13 NOTE — Therapy (Signed)
OUTPATIENT PHYSICAL THERAPY NOTE   Patient Name: Kim Gibson MRN: [id number] DOB:[birth year], 72 y.o., female Today's Date: 07/13/2023  END OF SESSION:  PT End of Session - 07/13/23 1526     Visit Number 2    Number of Visits 17    Date for PT Re-Evaluation 09/01/23    Authorization Type [employer]    Progress Note Due on Visit 10    PT Start Time 1530    PT Stop Time 1608    PT Time Calculation (min) 38 min    Activity Tolerance Patient tolerated treatment well    Behavior During Therapy [facility] for tasks assessed/performed              Past Medical History:  Diagnosis Date   Anginal pain (HCC) 02/10/2017   AT REST   Arthritis    Coronary artery disease    Diabetes mellitus without complication (HCC)    Hyperlipemia    Hypertension    Insomnia    Past Surgical History:  Procedure Laterality Date   CARDIAC CATHETERIZATION N/A 09/24/2016   Procedure: Left Heart Cath and Coronary Angiography;  Surgeon: Orpah Cobb, MD;  Location: [facility];  Service: Cardiovascular;  Laterality: N/A;   CARDIOVASCULAR STRESS TEST  11/29/2019   CESAREAN SECTION     CORONARY ANGIOGRAM  2005   LEFT HEART CATH AND CORONARY ANGIOGRAPHY N/A 04/06/2023   Procedure: LEFT HEART CATH AND CORONARY ANGIOGRAPHY;  Surgeon: Orpah Cobb, MD;  Location: [facility];  Service: Cardiovascular;  Laterality: N/A;   Patient Active Problem List   Diagnosis Date Noted   Acute coronary syndrome (HCC) 11/28/2019   Chest pain 02/10/2017   Precordial chest pain 09/24/2016   Angina at rest [facility]) 07/30/2015   Dysphagia 07/30/2015   Heart palpitations 07/30/2015   Diabetes type 2, uncontrolled 07/30/2015   HTN (hypertension) 07/30/2015   HLD (hyperlipidemia) 07/30/2015   Diabetes mellitus with complication (HCC)     PCP: Mitchell Heir, DO  REFERRING PROVIDER: Margart Sickles, PA-C  REFERRING DIAG: Thoraco-Lumbar Pain   Rationale for Evaluation and Treatment:  Rehabilitation  THERAPY DIAG:  Pain in thoracic spine  Other low back pain  ONSET DATE: April 2024.   SUBJECTIVE:                                                                                                                                                                                           SUBJECTIVE STATEMENT:  07/13/2023 Patient reporting to with PT with report of 9/10 pain with movements. However, she emphasizes that "it is not constant pain." She states that she  has only been working on her neck exercises.  Eval: Patient reporting to PT with "terrible back pain" in the middle of her back. She states that it's been worse for the past 6 months. She has difficulty waling upright when she first gets up, difficulty laying on her back, standing at the sink. Her pain sometimes radiates from the middle of her back to both sides. 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    PERTINENT HISTORY:  Relevant PMH includes: CAD, DM type 2, HTN, Arthritis, anginal pain, acute coronary syndrome  PAIN:  Are you having pain? Yes: NPRS scale: 4-9/10 Pain location: mid to lower back  Pain description: sharp  Aggravating factors: laying on her back, prolonged standing, bending down Relieving factors: brief relief from salonpas and heat pack   PRECAUTIONS: None  RED FLAGS: None   WEIGHT BEARING RESTRICTIONS: No  FALLS:  Has patient fallen in last 6 months? No  LIVING ENVIRONMENT: Lives with: lives with their family Lives in: House/apartment Stairs: Yes: Internal: several steps; can reach both Has following equipment at home: None  OCCUPATION: n/a   PLOF: Independent  PATIENT GOALS: To have less pain with daily activities   NEXT MD VISIT: unsure at this time  OBJECTIVE:  Note: Objective measures were completed at Evaluation unless otherwise noted.  DIAGNOSTIC FINDINGS:  No relevant imaging results available in  epic; none included in referral    PATIENT SURVEYS:  FOTO 36 current, 49 predicted  COGNITION: Overall cognitive status: Within functional limits for tasks assessed     SENSATION: Not tested  POSTURE: moderate rounded shoulders, forward head, and increased thoracic kyphosis  PALPATION: Moderate-to-severe tenderness with palpation along bilateral lower thoracic and upper lumbar paraspinals, bilateral periscapular mm (rhomboids, middle trap)   THORACOLUMBAR ROM:   AROM eval  Thoracic Flexion 25%  Thoracic Extension 75%  Right Rotation 50%  Left Rotation 50%  Right Lateral Flexion   Left Lateral Flexion    Lumbar Flexion    Lumbar Extension    (Blank rows = not tested)  % of full AROM  UPPER EXTREMITY MMT: to be formally assessed at f/u visit    MMT Right eval Left eval  Shoulder flexion    Shoulder extension    Shoulder abduction    Shoulder adduction    Shoulder extension    Shoulder internal rotation    Shoulder external rotation    Middle trapezius    Lower trapezius    Elbow flexion    Elbow extension    Wrist flexion    Wrist extension    Wrist ulnar deviation    Wrist radial deviation    Wrist pronation    Wrist supination    Grip strength     (Blank rows = not tested)    TODAY'S TREATMENT:                                                                                                       [facility] Adult PT  Treatment:                                                DATE: 07/13/2023  Therapeutic Exercise: NuStep level 3 x 5 minutes  Standing pball roll x 20  Sidelying open book 2 x 10 each side  Seated Cervical retraction 2 x 10  Seated scapular retraction 2 x 10  Seated pball roll, flex/left/right x 10 each  Seated lumbar spine extension with towel roll, 2 x 10   Reviewed HEP and ongoing patient education as noted below s    [facility] Adult PT Treatment:                                                DATE: 07/07/2023  Initial evaluation: see  patient education and home exercise program as noted below     PATIENT EDUCATION:  Education details: reviewed initial home exercise program; discussion of POC, prognosis and goals for skilled PT; additional time for discussion and patient education related to 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    Person educated: Patient Education method: Explanation, Demonstration, and Handouts Education comprehension: verbalized understanding, returned demonstration, and needs further education  HOME EXERCISE PROGRAM: Access Code: [facility] URL: [email or web address] Date: 07/07/2023 Prepared by: Mauri Reading  Exercises - Standing Cervical Retraction  - 1 x daily - 7 x weekly - 2 sets - 10 reps - 3 sec hold - Seated Scapular Retraction  - 1 x daily - 7 x weekly - 2 sets - 10 reps - 3 sec hold - Sidelying Open Book  - 1 x daily - 7 x weekly - 1 sets - 10 reps -  sec hold - Shoulder Flexion Wall Walk  - 1 x daily - 7 x weekly - 1 sets - 10 reps - 3 sec hold  ASSESSMENT:  CLINICAL IMPRESSION: Patient responded well to initial treatment session. She demonstrates improved understanding of exercises included in prescribed HEP and understands importance of compliance with this. We will reassess response to today's session at next visit and progress as appropriate within established POC.     OBJECTIVE IMPAIRMENTS: decreased activity tolerance, decreased endurance, decreased ROM, improper body mechanics, postural dysfunction, and pain.   ACTIVITY LIMITATIONS: carrying, lifting, bending, sitting, standing, sleeping, bed mobility, bathing, dressing, reach over head, and locomotion level  PARTICIPATION LIMITATIONS: meal prep, cleaning, laundry, interpersonal relationship, driving, and community activity  PERSONAL FACTORS: Age, Past/current experiences, Time since onset of injury/illness/exacerbation, and 3+ comorbidities: Relevant PMH includes: CAD, DM type 2, HTN,  Arthritis, anginal pain, acute coronary syndrome  are also affecting patient's functional outcome.   REHAB POTENTIAL: Fair    CLINICAL DECISION MAKING: Stable/uncomplicated  EVALUATION COMPLEXITY: Low   GOALS: Goals reviewed with patient? Yes  SHORT TERM GOALS: Target date: 08/04/2023  Patient will be independent with initial home program for initial postural strengthening and thoracic mobility. Baseline: provided at eval  Goal status: INITIAL  2.  Patient will demonstrate improved postural awareness for at least 15 minutes while seated without need for cueing from PT.   Baseline: moderate rounded shoulders, forward head, and increased thoracic kyphosis Goal status: INITIAL   LONG TERM GOALS: Target date: 09/01/2023   Patient will report improved  overall functional ability with FOTO score of 45 or greater.   Baseline: 36 Goal status: INITIAL  2.  Patient will report no more than 6/10 worst pain severity with ADLs and IADLs.  Baseline: 9/10 worst pain  Goal status: INITIAL  3.  Patient will demonstrate improved thoracolumbar mobility to at least 65-70% of full AROM.  Baseline:  AROM eval  Thoracic Flexion 25%  Thoracic Extension 75%  Right Rotation 50%  Left Rotation 50%   Goal status: INITIAL  4.  Patient will demonstrate ability to perform floor to waist lifting of at least 20# using appropriate body mechanics and with no more than minimal pain in order to safely perform normal daily/occupational tasks.   Baseline: unable Goal status: INITIAL  5.  Patient will demonstrate ability to perform overhead lifting of at least 5# using appropriate body mechanics and with no more than minimal pain in order to safely perform normal daily/occupational tasks.   Baseline: unable  Goal status: INITIAL   PLAN:  PT FREQUENCY: 1-2x/week  PT DURATION: 8 weeks  PLANNED INTERVENTIONS: 97146- PT Re-evaluation, 97110-Therapeutic exercises, 97530- Therapeutic activity, 97112-  Neuromuscular re-education, 97535- Self Care, 29562- Manual therapy, 97014- Electrical stimulation (unattended), [id number]- Electrical stimulation (manual), Dry Needling, Cryotherapy, and Moist heat.  PLAN FOR NEXT SESSION: assess: lumbar AROM, UE/periscapular strength; interventions: TS/LS mobility, postural endurance, UE aerobic activity, wall slides, manual therapy and modalities as indicated    Mauri Reading, PT, DPT  07/13/2023, 4:19 PM

## 2023-07-20 ENCOUNTER — Encounter: Payer: Self-pay | Admitting: Physical Therapy

## 2023-07-20 ENCOUNTER — Ambulatory Visit: Payer: 59 | Admitting: Physical Therapy

## 2023-07-20 DIAGNOSIS — M5459 Other low back pain: Secondary | ICD-10-CM

## 2023-07-20 DIAGNOSIS — M546 Pain in thoracic spine: Secondary | ICD-10-CM | POA: Diagnosis not present

## 2023-07-20 NOTE — Therapy (Signed)
OUTPATIENT PHYSICAL THERAPY NOTE   Patient Name: Kim Gibson MRN: [id number] DOB:May 12, 1951, 72 y.o., female Today's Date: 07/20/2023  END OF SESSION:  PT End of Session - 07/20/23 1051     Visit Number 3    Number of Visits 17    Date for PT Re-Evaluation 09/01/23    Authorization Type [employer] MCR    Progress Note Due on Visit 10    PT Start Time 1051    PT Stop Time 1130    PT Time Calculation (min) 39 min    Activity Tolerance Patient tolerated treatment well    Behavior During Therapy [facility] for tasks assessed/performed              Past Medical History:  Diagnosis Date   Anginal pain (HCC) 02/10/2017   AT REST   Arthritis    Coronary artery disease    Diabetes mellitus without complication (HCC)    Hyperlipemia    Hypertension    Insomnia    Past Surgical History:  Procedure Laterality Date   CARDIAC CATHETERIZATION N/A 09/24/2016   Procedure: Left Heart Cath and Coronary Angiography;  Surgeon: Orpah Cobb, MD;  Location: [facility] IN[facility];  Service: Cardiovascular;  Laterality: N/A;   CARDIOVASCULAR STRESS TEST  11/29/2019   CESAREAN SECTION     CORONARY ANGIOGRAM  2005   LEFT HEART CATH AND CORONARY ANGIOGRAPHY N/A 04/06/2023   Procedure: LEFT HEART CATH AND CORONARY ANGIOGRAPHY;  Surgeon: Orpah Cobb, MD;  Location: [facility];  Service: Cardiovascular;  Laterality: N/A;   Patient Active Problem List   Diagnosis Date Noted   Acute coronary syndrome (HCC) 11/28/2019   Chest pain 02/10/2017   Precordial chest pain 09/24/2016   Angina at rest [facility]) 07/30/2015   Dysphagia 07/30/2015   Heart palpitations 07/30/2015   Diabetes type 2, uncontrolled 07/30/2015   HTN (hypertension) 07/30/2015   HLD (hyperlipidemia) 07/30/2015   Diabetes mellitus with complication (HCC)     PCP: Mitchell Heir, DO  REFERRING PROVIDER: Margart Sickles, PA-C  REFERRING DIAG: Thoraco-Lumbar Pain   Rationale for Evaluation and Treatment:  Rehabilitation  THERAPY DIAG:  Pain in thoracic spine  Other low back pain  ONSET DATE: April 2024.   SUBJECTIVE:                                                                                                                                                                                           SUBJECTIVE STATEMENT:  07/20/2023 Pt reports that she has performed her exercises 2x since her last visit and they are helpful.  She rates her current pain 5/10  Eval: Patient reporting to PT with "terrible back pain" in the middle of her back. She states that it's been worse for the past 6 months. She has difficulty waling upright when she first gets up, difficulty laying on her back, standing at the sink. Her pain sometimes radiates from the middle of her back to both sides. 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    PERTINENT HISTORY:  Relevant PMH includes: CAD, DM type 2, HTN, Arthritis, anginal pain, acute coronary syndrome  PAIN:  Are you having pain? Yes: NPRS scale: 4-9/10 Pain location: mid to lower back  Pain description: sharp  Aggravating factors: laying on her back, prolonged standing, bending down Relieving factors: brief relief from salonpas and heat pack   PRECAUTIONS: None  RED FLAGS: None   WEIGHT BEARING RESTRICTIONS: No  FALLS:  Has patient fallen in last 6 months? No  LIVING ENVIRONMENT: Lives with: lives with their family Lives in: House/apartment Stairs: Yes: Internal: several steps; can reach both Has following equipment at home: None  OCCUPATION: n/a   PLOF: Independent  PATIENT GOALS: To have less pain with daily activities   NEXT MD VISIT: unsure at this time  OBJECTIVE:  Note: Objective measures were completed at Evaluation unless otherwise noted.  DIAGNOSTIC FINDINGS:  No relevant imaging results available in epic; none included in referral    PATIENT SURVEYS:  FOTO 36  current, 49 predicted  COGNITION: Overall cognitive status: Within functional limits for tasks assessed     SENSATION: Not tested  POSTURE: moderate rounded shoulders, forward head, and increased thoracic kyphosis  PALPATION: Moderate-to-severe tenderness with palpation along bilateral lower thoracic and upper lumbar paraspinals, bilateral periscapular mm (rhomboids, middle trap)   THORACOLUMBAR ROM:   AROM eval  Thoracic Flexion 25%  Thoracic Extension 75%  Right Rotation 50%  Left Rotation 50%  Right Lateral Flexion   Left Lateral Flexion    Lumbar Flexion    Lumbar Extension    (Blank rows = not tested)  % of full AROM  UPPER EXTREMITY MMT: to be formally assessed at f/u visit    MMT Right eval Left eval  Shoulder flexion    Shoulder extension    Shoulder abduction    Shoulder adduction    Shoulder extension    Shoulder internal rotation    Shoulder external rotation    Middle trapezius    Lower trapezius    Elbow flexion    Elbow extension    Wrist flexion    Wrist extension    Wrist ulnar deviation    Wrist radial deviation    Wrist pronation    Wrist supination    Grip strength     (Blank rows = not tested)    TODAY'S TREATMENT:      [facility] Adult PT Treatment:                                                DATE: 07/20/2023  Therapeutic Exercise: NuStep level 6 x 5 minutes  Thoracic ext on 1/2 foam roller in sitting - x20 W/ rotation x10 Chest press with SA punch in supine - 20x - 2# weights Supine fly with 2# weights - 20x Supine horizontal abd with RTB -  Standing pball roll x 20  Supine  scap setting - 2x10 Sidelying open book 2 x 10 each side GTB row - 20x GTB shoulder ext - 20x  Reviewing HEP   Consider chops and paloff press                                                                                           [facility] Adult PT Treatment:                                                DATE: 07/13/2023  Therapeutic Exercise: NuStep  level 3 x 5 minutes  Standing pball roll x 20  Sidelying open book 2 x 10 each side  Seated Cervical retraction 2 x 10  Seated scapular retraction 2 x 10  Seated pball roll, flex/left/right x 10 each  Seated lumbar spine extension with towel roll, 2 x 10   Reviewed HEP and ongoing patient education as noted below s    [facility] Adult PT Treatment:                                                DATE: 07/07/2023  Initial evaluation: see patient education and home exercise program as noted below     PATIENT EDUCATION:  Education details: reviewed initial home exercise program; discussion of POC, prognosis and goals for skilled PT; additional time for discussion and patient education related to 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    Person educated: Patient Education method: Explanation, Demonstration, and Handouts Education comprehension: verbalized understanding, returned demonstration, and needs further education  HOME EXERCISE PROGRAM: Access Code: [facility] URL: [email or web address] Date: 07/07/2023 Prepared by: Mauri Reading  Exercises - Standing Cervical Retraction  - 1 x daily - 7 x weekly - 2 sets - 10 reps - 3 sec hold - Seated Scapular Retraction  - 1 x daily - 7 x weekly - 2 sets - 10 reps - 3 sec hold - Sidelying Open Book  - 1 x daily - 7 x weekly - 1 sets - 10 reps -  sec hold - Shoulder Flexion Wall Walk  - 1 x daily - 7 x weekly - 1 sets - 10 reps - 3 sec hold  ASSESSMENT:  CLINICAL IMPRESSION: Maydelin tolerated session well with no adverse reaction.  We concentrated on thoracic mobility combined periscapular strengthening.  She only reports pain with bed mobility and this is very brief (rotation).  She reports 3/10 pain following session.  We discussed the benefit of exercise on pain and the importance of completing these regularly at home.   OBJECTIVE IMPAIRMENTS: decreased activity tolerance, decreased endurance, decreased  ROM, improper body mechanics, postural dysfunction, and pain.   ACTIVITY LIMITATIONS: carrying, lifting, bending, sitting, standing, sleeping, bed mobility, bathing, dressing, reach over head, and locomotion level  PARTICIPATION LIMITATIONS: meal prep, cleaning, laundry, interpersonal relationship, driving, and community activity  PERSONAL FACTORS: Age,  Past/current experiences, Time since onset of injury/illness/exacerbation, and 3+ comorbidities: Relevant PMH includes: CAD, DM type 2, HTN, Arthritis, anginal pain, acute coronary syndrome  are also affecting patient's functional outcome.   REHAB POTENTIAL: Fair    CLINICAL DECISION MAKING: Stable/uncomplicated  EVALUATION COMPLEXITY: Low   GOALS: Goals reviewed with patient? Yes  SHORT TERM GOALS: Target date: 08/04/2023  Patient will be independent with initial home program for initial postural strengthening and thoracic mobility. Baseline: provided at eval  Goal status: INITIAL  2.  Patient will demonstrate improved postural awareness for at least 15 minutes while seated without need for cueing from PT.   Baseline: moderate rounded shoulders, forward head, and increased thoracic kyphosis Goal status: INITIAL   LONG TERM GOALS: Target date: 09/01/2023   Patient will report improved overall functional ability with FOTO score of 45 or greater.   Baseline: 36 Goal status: INITIAL  2.  Patient will report no more than 6/10 worst pain severity with ADLs and IADLs.  Baseline: 9/10 worst pain  Goal status: INITIAL  3.  Patient will demonstrate improved thoracolumbar mobility to at least 65-70% of full AROM.  Baseline:  AROM eval  Thoracic Flexion 25%  Thoracic Extension 75%  Right Rotation 50%  Left Rotation 50%   Goal status: INITIAL  4.  Patient will demonstrate ability to perform floor to waist lifting of at least 20# using appropriate body mechanics and with no more than minimal pain in order to safely perform normal  daily/occupational tasks.   Baseline: unable Goal status: INITIAL  5.  Patient will demonstrate ability to perform overhead lifting of at least 5# using appropriate body mechanics and with no more than minimal pain in order to safely perform normal daily/occupational tasks.   Baseline: unable  Goal status: INITIAL   PLAN:  PT FREQUENCY: 1-2x/week  PT DURATION: 8 weeks  PLANNED INTERVENTIONS: 97146- PT Re-evaluation, 97110-Therapeutic exercises, 97530- Therapeutic activity, 97112- Neuromuscular re-education, 97535- Self Care, 16109- Manual therapy, 97014- Electrical stimulation (unattended), [id number]- Electrical stimulation (manual), Dry Needling, Cryotherapy, and Moist heat.  PLAN FOR NEXT SESSION: assess: lumbar AROM, UE/periscapular strength; interventions: TS/LS mobility, postural endurance, UE aerobic activity, wall slides, manual therapy and modalities as indicated    Kim Gibson PT  07/20/2023, 11:30 AM

## 2023-07-22 ENCOUNTER — Ambulatory Visit: Payer: 59 | Admitting: Physical Therapy

## 2023-07-22 ENCOUNTER — Encounter: Payer: Self-pay | Admitting: Physical Therapy

## 2023-07-22 DIAGNOSIS — M546 Pain in thoracic spine: Secondary | ICD-10-CM

## 2023-07-22 DIAGNOSIS — M5459 Other low back pain: Secondary | ICD-10-CM

## 2023-07-22 NOTE — Therapy (Unsigned)
OUTPATIENT PHYSICAL THERAPY NOTE   Patient Name: Kim Gibson MRN: [id number] DOB:04-04-1951, 72 y.o., female Today's Date: 07/23/2023  END OF SESSION:  PT End of Session - 07/22/23 1135     Visit Number 4    Number of Visits 17    Date for PT Re-Evaluation 09/01/23    Authorization Type UHC MCR    Progress Note Due on Visit 10    PT Start Time 1133    PT Stop Time 1214    PT Time Calculation (min) 41 min    Activity Tolerance Patient tolerated treatment well    Behavior During Therapy [facility] for tasks assessed/performed              Past Medical History:  Diagnosis Date   Anginal pain (HCC) 02/10/2017   AT REST   Arthritis    Coronary artery disease    Diabetes mellitus without complication (HCC)    Hyperlipemia    Hypertension    Insomnia    Past Surgical History:  Procedure Laterality Date   CARDIAC CATHETERIZATION N/A 09/24/2016   Procedure: Left Heart Cath and Coronary Angiography;  Surgeon: Orpah Cobb, MD;  Location: [facility];  Service: Cardiovascular;  Laterality: N/A;   CARDIOVASCULAR STRESS TEST  11/29/2019   CESAREAN SECTION     CORONARY ANGIOGRAM  2005   LEFT HEART CATH AND CORONARY ANGIOGRAPHY N/A 04/06/2023   Procedure: LEFT HEART CATH AND CORONARY ANGIOGRAPHY;  Surgeon: Orpah Cobb, MD;  Location: [facility];  Service: Cardiovascular;  Laterality: N/A;   Patient Active Problem List   Diagnosis Date Noted   Acute coronary syndrome (HCC) 11/28/2019   Chest pain 02/10/2017   Precordial chest pain 09/24/2016   Angina at rest [facility]) 07/30/2015   Dysphagia 07/30/2015   Heart palpitations 07/30/2015   Diabetes type 2, uncontrolled 07/30/2015   HTN (hypertension) 07/30/2015   HLD (hyperlipidemia) 07/30/2015   Diabetes mellitus with complication (HCC)     PCP: Mitchell Heir, DO  REFERRING PROVIDER: Margart Sickles, PA-C  REFERRING DIAG: Thoraco-Lumbar Pain   Rationale for Evaluation and Treatment:  Rehabilitation  THERAPY DIAG:  Pain in thoracic spine  Other low back pain  ONSET DATE: April 2024.   SUBJECTIVE:                                                                                                                                                                                           SUBJECTIVE STATEMENT:  07/23/2023 Pt reports that he pain is "sometimes better" and sometimes her "normal" pain, but overall seems slightly improved.  She rates the pain 8/10  currently.  Eval: Patient reporting to PT with "terrible back pain" in the middle of her back. She states that it's been worse for the past 6 months. She has difficulty waling upright when she first gets up, difficulty laying on her back, standing at the sink. Her pain sometimes radiates from the middle of her back to both sides. 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    PERTINENT HISTORY:  Relevant PMH includes: CAD, DM type 2, HTN, Arthritis, anginal pain, acute coronary syndrome  PAIN:  Are you having pain? Yes: NPRS scale: 4-9/10 Pain location: mid to lower back  Pain description: sharp  Aggravating factors: laying on her back, prolonged standing, bending down Relieving factors: brief relief from salonpas and heat pack   PRECAUTIONS: None  RED FLAGS: None   WEIGHT BEARING RESTRICTIONS: No  FALLS:  Has patient fallen in last 6 months? No  LIVING ENVIRONMENT: Lives with: lives with their family Lives in: House/apartment Stairs: Yes: Internal: several steps; can reach both Has following equipment at home: None  OCCUPATION: n/a   PLOF: Independent  PATIENT GOALS: To have less pain with daily activities   NEXT MD VISIT: unsure at this time  OBJECTIVE:  Note: Objective measures were completed at Evaluation unless otherwise noted.  DIAGNOSTIC FINDINGS:  No relevant imaging results available in epic; none included in referral     PATIENT SURVEYS:  FOTO 36 current, 49 predicted  COGNITION: Overall cognitive status: Within functional limits for tasks assessed     SENSATION: Not tested  POSTURE: moderate rounded shoulders, forward head, and increased thoracic kyphosis  PALPATION: Moderate-to-severe tenderness with palpation along bilateral lower thoracic and upper lumbar paraspinals, bilateral periscapular mm (rhomboids, middle trap)   THORACOLUMBAR ROM:   AROM eval  Thoracic Flexion 25%  Thoracic Extension 75%  Right Rotation 50%  Left Rotation 50%  Right Lateral Flexion   Left Lateral Flexion    Lumbar Flexion    Lumbar Extension    (Blank rows = not tested)  % of full AROM  UPPER EXTREMITY MMT: to be formally assessed at f/u visit    MMT Right eval Left eval  Shoulder flexion    Shoulder extension    Shoulder abduction    Shoulder adduction    Shoulder extension    Shoulder internal rotation    Shoulder external rotation    Middle trapezius    Lower trapezius    Elbow flexion    Elbow extension    Wrist flexion    Wrist extension    Wrist ulnar deviation    Wrist radial deviation    Wrist pronation    Wrist supination    Grip strength     (Blank rows = not tested)    TODAY'S TREATMENT:      [facility] Adult PT Treatment:                                                DATE: 07/21/2023  Therapeutic Exercise: NuStep level 6 x 5 minutes  Thoracic ext on 1/2 foam roller in sitting - x20 W/ rotation x10 Chest press with SA punch in supine - 20x - 2# weights Cat camel - 2x10 Supine fly with 2# weights - 20x Supine horizontal abd with RTB - 20x Standing  pball roll x 20  Supine scap setting - 2x10 Sidelying open book 2 x 10 each side Blue TB row - 20x Paloff press -  red TB - 15x ea   Consider chops                        Manual Therapy  CPA and UPA mid thoracic spine, pt in prone                                                                    [facility] Adult PT Treatment:                                                 DATE: 07/13/2023  Therapeutic Exercise: NuStep level 3 x 5 minutes  Standing pball roll x 20  Sidelying open book 2 x 10 each side  Seated Cervical retraction 2 x 10  Seated scapular retraction 2 x 10  Seated pball roll, flex/left/right x 10 each  Seated lumbar spine extension with towel roll, 2 x 10   Reviewed HEP and ongoing patient education as noted below s    [facility] Adult PT Treatment:                                                DATE: 07/07/2023  Initial evaluation: see patient education and home exercise program as noted below     PATIENT EDUCATION:  Education details: reviewed initial home exercise program; discussion of POC, prognosis and goals for skilled PT; additional time for discussion and patient education related to 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    Person educated: Patient Education method: Explanation, Demonstration, and Handouts Education comprehension: verbalized understanding, returned demonstration, and needs further education  HOME EXERCISE PROGRAM: Access Code: [facility] URL: [email or web address] Date: 07/07/2023 Prepared by: Mauri Reading  Exercises - Standing Cervical Retraction  - 1 x daily - 7 x weekly - 2 sets - 10 reps - 3 sec hold - Seated Scapular Retraction  - 1 x daily - 7 x weekly - 2 sets - 10 reps - 3 sec hold - Sidelying Open Book  - 1 x daily - 7 x weekly - 1 sets - 10 reps -  sec hold - Shoulder Flexion Wall Walk  - 1 x daily - 7 x weekly - 1 sets - 10 reps - 3 sec hold  ASSESSMENT:  CLINICAL IMPRESSION: Keith tolerated session well with no adverse reaction.  We concentrated on thoracic mobility combined periscapular strengthening.  Added in manual therapy for pain modulation.  Pt requires frequent cuing for form and pacing throughout.  Pt reports significant reduction in pain following treatment to 6.5/10   OBJECTIVE IMPAIRMENTS: decreased  activity tolerance, decreased endurance, decreased ROM, improper body mechanics, postural dysfunction, and pain.   ACTIVITY LIMITATIONS: carrying, lifting, bending, sitting, standing, sleeping, bed mobility, bathing, dressing, reach over head, and locomotion level  PARTICIPATION LIMITATIONS: meal prep, cleaning, laundry, interpersonal relationship, driving,  and community activity  PERSONAL FACTORS: Age, Past/current experiences, Time since onset of injury/illness/exacerbation, and 3+ comorbidities: Relevant PMH includes: CAD, DM type 2, HTN, Arthritis, anginal pain, acute coronary syndrome  are also affecting patient's functional outcome.   REHAB POTENTIAL: Fair    CLINICAL DECISION MAKING: Stable/uncomplicated  EVALUATION COMPLEXITY: Low   GOALS: Goals reviewed with patient? Yes  SHORT TERM GOALS: Target date: 08/04/2023  Patient will be independent with initial home program for initial postural strengthening and thoracic mobility. Baseline: provided at eval  Goal status: INITIAL  2.  Patient will demonstrate improved postural awareness for at least 15 minutes while seated without need for cueing from PT.   Baseline: moderate rounded shoulders, forward head, and increased thoracic kyphosis Goal status: INITIAL   LONG TERM GOALS: Target date: 09/01/2023   Patient will report improved overall functional ability with FOTO score of 45 or greater.   Baseline: 36 Goal status: INITIAL  2.  Patient will report no more than 6/10 worst pain severity with ADLs and IADLs.  Baseline: 9/10 worst pain  Goal status: INITIAL  3.  Patient will demonstrate improved thoracolumbar mobility to at least 65-70% of full AROM.  Baseline:  AROM eval  Thoracic Flexion 25%  Thoracic Extension 75%  Right Rotation 50%  Left Rotation 50%   Goal status: INITIAL  4.  Patient will demonstrate ability to perform floor to waist lifting of at least 20# using appropriate body mechanics and with no more than  minimal pain in order to safely perform normal daily/occupational tasks.   Baseline: unable Goal status: INITIAL  5.  Patient will demonstrate ability to perform overhead lifting of at least 5# using appropriate body mechanics and with no more than minimal pain in order to safely perform normal daily/occupational tasks.   Baseline: unable  Goal status: INITIAL   PLAN:  PT FREQUENCY: 1-2x/week  PT DURATION: 8 weeks  PLANNED INTERVENTIONS: 97146- PT Re-evaluation, 97110-Therapeutic exercises, 97530- Therapeutic activity, 97112- Neuromuscular re-education, 97535- Self Care, 91478- Manual therapy, 97014- Electrical stimulation (unattended), [id number]- Electrical stimulation (manual), Dry Needling, Cryotherapy, and Moist heat.  PLAN FOR NEXT SESSION: assess: lumbar AROM, UE/periscapular strength; interventions: TS/LS mobility, postural endurance, UE aerobic activity, wall slides, manual therapy and modalities as indicated    Kimberlee Nearing Jerrico Covello PT  07/23/2023, 8:00 AM

## 2023-07-26 ENCOUNTER — Ambulatory Visit: Payer: 59

## 2023-07-28 ENCOUNTER — Encounter: Payer: Self-pay | Admitting: Physical Therapy

## 2023-07-28 ENCOUNTER — Ambulatory Visit: Payer: 59 | Admitting: Physical Therapy

## 2023-07-28 DIAGNOSIS — M546 Pain in thoracic spine: Secondary | ICD-10-CM

## 2023-07-28 DIAGNOSIS — M5459 Other low back pain: Secondary | ICD-10-CM

## 2023-07-28 NOTE — Therapy (Signed)
OUTPATIENT PHYSICAL THERAPY NOTE   Patient Name: Kim Gibson MRN: [id number] DOB:1950-12-16, 72 y.o., female Today's Date: 07/28/2023  END OF SESSION:  PT End of Session - 07/28/23 1218     Visit Number 5    Number of Visits 17    Date for PT Re-Evaluation 09/01/23    Authorization Type [employer] MCR    Progress Note Due on Visit 10    PT Start Time 1218    PT Stop Time 1300    PT Time Calculation (min) 42 min    Activity Tolerance Patient tolerated treatment well    Behavior During Therapy [facility] for tasks assessed/performed              Past Medical History:  Diagnosis Date   Anginal pain (HCC) 02/10/2017   AT REST   Arthritis    Coronary artery disease    Diabetes mellitus without complication (HCC)    Hyperlipemia    Hypertension    Insomnia    Past Surgical History:  Procedure Laterality Date   CARDIAC CATHETERIZATION N/A 09/24/2016   Procedure: Left Heart Cath and Coronary Angiography;  Surgeon: Orpah Cobb, MD;  Location: [facility];  Service: Cardiovascular;  Laterality: N/A;   CARDIOVASCULAR STRESS TEST  11/29/2019   CESAREAN SECTION     CORONARY ANGIOGRAM  2005   LEFT HEART CATH AND CORONARY ANGIOGRAPHY N/A 04/06/2023   Procedure: LEFT HEART CATH AND CORONARY ANGIOGRAPHY;  Surgeon: Orpah Cobb, MD;  Location: [facility];  Service: Cardiovascular;  Laterality: N/A;   Patient Active Problem List   Diagnosis Date Noted   Acute coronary syndrome (HCC) 11/28/2019   Chest pain 02/10/2017   Precordial chest pain 09/24/2016   Angina at rest [facility]) 07/30/2015   Dysphagia 07/30/2015   Heart palpitations 07/30/2015   Diabetes type 2, uncontrolled 07/30/2015   HTN (hypertension) 07/30/2015   HLD (hyperlipidemia) 07/30/2015   Diabetes mellitus with complication (HCC)     PCP: Mitchell Heir, DO  REFERRING PROVIDER: Margart Sickles, PA-C  REFERRING DIAG: Thoraco-Lumbar Pain   Rationale for Evaluation and Treatment:  Rehabilitation  THERAPY DIAG:  Pain in thoracic spine  Other low back pain  ONSET DATE: April 2024.   SUBJECTIVE:                                                                                                                                                                                           SUBJECTIVE STATEMENT:  07/28/2023 Pt reports that last session was very beneficial but she is feeling some significant improvement.  She rates the current pain 4/10.  Eval:  Patient reporting to PT with "terrible back pain" in the middle of her back. She states that it's been worse for the past 6 months. She has difficulty waling upright when she first gets up, difficulty laying on her back, standing at the sink. Her pain sometimes radiates from the middle of her back to both sides. 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    PERTINENT HISTORY:  Relevant PMH includes: CAD, DM type 2, HTN, Arthritis, anginal pain, acute coronary syndrome  PAIN:  Are you having pain? Yes: NPRS scale: 4-9/10 Pain location: mid to lower back  Pain description: sharp  Aggravating factors: laying on her back, prolonged standing, bending down Relieving factors: brief relief from salonpas and heat pack   PRECAUTIONS: None  RED FLAGS: None   WEIGHT BEARING RESTRICTIONS: No  FALLS:  Has patient fallen in last 6 months? No  LIVING ENVIRONMENT: Lives with: lives with their family Lives in: House/apartment Stairs: Yes: Internal: several steps; can reach both Has following equipment at home: None  OCCUPATION: n/a   PLOF: Independent  PATIENT GOALS: To have less pain with daily activities   NEXT MD VISIT: unsure at this time  OBJECTIVE:  Note: Objective measures were completed at Evaluation unless otherwise noted.  DIAGNOSTIC FINDINGS:  No relevant imaging results available in epic; none included in referral    PATIENT SURVEYS:  FOTO  36 current, 49 predicted  COGNITION: Overall cognitive status: Within functional limits for tasks assessed     SENSATION: Not tested  POSTURE: moderate rounded shoulders, forward head, and increased thoracic kyphosis  PALPATION: Moderate-to-severe tenderness with palpation along bilateral lower thoracic and upper lumbar paraspinals, bilateral periscapular mm (rhomboids, middle trap)   THORACOLUMBAR ROM:   AROM eval  Thoracic Flexion 25%  Thoracic Extension 75%  Right Rotation 50%  Left Rotation 50%  Right Lateral Flexion   Left Lateral Flexion    Lumbar Flexion    Lumbar Extension    (Blank rows = not tested)  % of full AROM  UPPER EXTREMITY MMT: to be formally assessed at f/u visit    MMT Right eval Left eval  Shoulder flexion    Shoulder extension    Shoulder abduction    Shoulder adduction    Shoulder extension    Shoulder internal rotation    Shoulder external rotation    Middle trapezius    Lower trapezius    Elbow flexion    Elbow extension    Wrist flexion    Wrist extension    Wrist ulnar deviation    Wrist radial deviation    Wrist pronation    Wrist supination    Grip strength     (Blank rows = not tested)    TODAY'S TREATMENT:      [facility] Adult PT Treatment:                                                DATE: 07/28/2023  Therapeutic Exercise: NuStep level 6 x 5 minutes  Thoracic ext on 1/2 foam roller in sitting - x20 W/ rotation x10 Chest press with SA punch in supine - 20x - 3# weights Cat camel - 2x10 Thread the needle - 10x ea Supine fly with 3# weights - 20x Supine horizontal abd with GTB -  20x Standing pball roll x 20  Supine scap setting - 2x10 Sidelying open book 2 x 10 each side Black TB row - 20x Paloff press -  Green TB - 15x ea   Consider chops                        Manual Therapy  CPA and UPA lower thoracic spine, pt in prone                                                                    [facility] Adult PT  Treatment:                                                DATE: 07/13/2023  Therapeutic Exercise: NuStep level 3 x 5 minutes  Standing pball roll x 20  Sidelying open book 2 x 10 each side  Seated Cervical retraction 2 x 10  Seated scapular retraction 2 x 10  Seated pball roll, flex/left/right x 10 each  Seated lumbar spine extension with towel roll, 2 x 10   Reviewed HEP and ongoing patient education as noted below s    [facility] Adult PT Treatment:                                                DATE: 07/07/2023  Initial evaluation: see patient education and home exercise program as noted below     PATIENT EDUCATION:  Education details: reviewed initial home exercise program; discussion of POC, prognosis and goals for skilled PT; additional time for discussion and patient education related to 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    Person educated: Patient Education method: Explanation, Demonstration, and Handouts Education comprehension: verbalized understanding, returned demonstration, and needs further education  HOME EXERCISE PROGRAM: Access Code: [facility] URL: [email or web address] Date: 07/07/2023 Prepared by: Mauri Reading  Exercises - Standing Cervical Retraction  - 1 x daily - 7 x weekly - 2 sets - 10 reps - 3 sec hold - Seated Scapular Retraction  - 1 x daily - 7 x weekly - 2 sets - 10 reps - 3 sec hold - Sidelying Open Book  - 1 x daily - 7 x weekly - 1 sets - 10 reps -  sec hold - Shoulder Flexion Wall Walk  - 1 x daily - 7 x weekly - 1 sets - 10 reps - 3 sec hold  ASSESSMENT:  CLINICAL IMPRESSION: Monika tolerated session well with no adverse reaction.  Pt continues to show increased exercise tolerance today.  She reports subjective improvement in pain at home but is still having some discomfort with bed mobility.  Manual today concentrated on lower thoracic spine today; she shows some increased sensitivity around TL junction  today which she reports improves with UPAs.    OBJECTIVE IMPAIRMENTS: decreased activity tolerance, decreased endurance, decreased ROM, improper body mechanics, postural dysfunction, and pain.   ACTIVITY LIMITATIONS: carrying, lifting, bending, sitting, standing, sleeping, bed mobility, bathing, dressing,  reach over head, and locomotion level  PARTICIPATION LIMITATIONS: meal prep, cleaning, laundry, interpersonal relationship, driving, and community activity  PERSONAL FACTORS: Age, Past/current experiences, Time since onset of injury/illness/exacerbation, and 3+ comorbidities: Relevant PMH includes: CAD, DM type 2, HTN, Arthritis, anginal pain, acute coronary syndrome  are also affecting patient's functional outcome.   REHAB POTENTIAL: Fair    CLINICAL DECISION MAKING: Stable/uncomplicated  EVALUATION COMPLEXITY: Low   GOALS: Goals reviewed with patient? Yes  SHORT TERM GOALS: Target date: 08/04/2023  Patient will be independent with initial home program for initial postural strengthening and thoracic mobility. Baseline: provided at eval  Goal status: INITIAL  2.  Patient will demonstrate improved postural awareness for at least 15 minutes while seated without need for cueing from PT.   Baseline: moderate rounded shoulders, forward head, and increased thoracic kyphosis Goal status: INITIAL   LONG TERM GOALS: Target date: 09/01/2023   Patient will report improved overall functional ability with FOTO score of 45 or greater.   Baseline: 36 Goal status: INITIAL  2.  Patient will report no more than 6/10 worst pain severity with ADLs and IADLs.  Baseline: 9/10 worst pain  Goal status: INITIAL  3.  Patient will demonstrate improved thoracolumbar mobility to at least 65-70% of full AROM.  Baseline:  AROM eval  Thoracic Flexion 25%  Thoracic Extension 75%  Right Rotation 50%  Left Rotation 50%   Goal status: INITIAL  4.  Patient will demonstrate ability to perform floor to  waist lifting of at least 20# using appropriate body mechanics and with no more than minimal pain in order to safely perform normal daily/occupational tasks.   Baseline: unable Goal status: INITIAL  5.  Patient will demonstrate ability to perform overhead lifting of at least 5# using appropriate body mechanics and with no more than minimal pain in order to safely perform normal daily/occupational tasks.   Baseline: unable  Goal status: INITIAL   PLAN:  PT FREQUENCY: 1-2x/week  PT DURATION: 8 weeks  PLANNED INTERVENTIONS: 97146- PT Re-evaluation, 97110-Therapeutic exercises, 97530- Therapeutic activity, 97112- Neuromuscular re-education, 97535- Self Care, 59563- Manual therapy, 97014- Electrical stimulation (unattended), [id number]- Electrical stimulation (manual), Dry Needling, Cryotherapy, and Moist heat.  PLAN FOR NEXT SESSION: assess: lumbar AROM, UE/periscapular strength; interventions: TS/LS mobility, postural endurance, UE aerobic activity, wall slides, manual therapy and modalities as indicated    Kimberlee Nearing Birtie Fellman PT  07/28/2023, 1:10 PM

## 2023-07-29 ENCOUNTER — Other Ambulatory Visit: Payer: Self-pay | Admitting: Physician Assistant

## 2023-07-29 DIAGNOSIS — M48061 Spinal stenosis, lumbar region without neurogenic claudication: Secondary | ICD-10-CM

## 2023-07-29 DIAGNOSIS — M5126 Other intervertebral disc displacement, lumbar region: Secondary | ICD-10-CM

## 2023-08-02 ENCOUNTER — Ambulatory Visit: Payer: 59 | Admitting: Physical Therapy

## 2023-08-04 ENCOUNTER — Ambulatory Visit: Payer: 59 | Attending: Physician Assistant

## 2023-08-04 DIAGNOSIS — M546 Pain in thoracic spine: Secondary | ICD-10-CM | POA: Insufficient documentation

## 2023-08-04 DIAGNOSIS — M5459 Other low back pain: Secondary | ICD-10-CM | POA: Insufficient documentation

## 2023-08-04 NOTE — Therapy (Signed)
OUTPATIENT PHYSICAL THERAPY NOTE   Patient Name: Kim Gibson MRN: [id number] DOB:[birth year], 72 y.o., female Today's Date: 08/04/2023  END OF SESSION:  PT End of Session - 08/04/23 1051     Visit Number 6    Number of Visits 17    Date for PT Re-Evaluation 09/01/23    Authorization Type [employer] MCR    PT Start Time 1055    PT Stop Time 1135    PT Time Calculation (min) 40 min    Activity Tolerance Patient tolerated treatment well    Behavior During Therapy [facility] for tasks assessed/performed               Past Medical History:  Diagnosis Date   Anginal pain (HCC) 02/10/2017   AT REST   Arthritis    Coronary artery disease    Diabetes mellitus without complication (HCC)    Hyperlipemia    Hypertension    Insomnia    Past Surgical History:  Procedure Laterality Date   CARDIAC CATHETERIZATION N/A 09/24/2016   Procedure: Left Heart Cath and Coronary Angiography;  Surgeon: Orpah Cobb, MD;  Location: [facility];  Service: Cardiovascular;  Laterality: N/A;   CARDIOVASCULAR STRESS TEST  11/29/2019   CESAREAN SECTION     CORONARY ANGIOGRAM  2005   LEFT HEART CATH AND CORONARY ANGIOGRAPHY N/A 04/06/2023   Procedure: LEFT HEART CATH AND CORONARY ANGIOGRAPHY;  Surgeon: Orpah Cobb, MD;  Location: [facility];  Service: Cardiovascular;  Laterality: N/A;   Patient Active Problem List   Diagnosis Date Noted   Acute coronary syndrome (HCC) 11/28/2019   Chest pain 02/10/2017   Precordial chest pain 09/24/2016   Angina at rest [facility]) 07/30/2015   Dysphagia 07/30/2015   Heart palpitations 07/30/2015   Diabetes type 2, uncontrolled 07/30/2015   HTN (hypertension) 07/30/2015   HLD (hyperlipidemia) 07/30/2015   Diabetes mellitus with complication (HCC)     PCP: Mitchell Heir, DO  REFERRING PROVIDER: Margart Sickles, PA-C  REFERRING DIAG: Thoraco-Lumbar Pain   Rationale for Evaluation and Treatment: Rehabilitation  THERAPY DIAG:  Pain in  thoracic spine  Other low back pain  ONSET DATE: April 2024.   SUBJECTIVE:                                                                                                                                                                                           SUBJECTIVE STATEMENT:  08/04/2023 Patient reports that she is feeling improvement in her pain severity which is a 3-4/10 this morning. She has been working on her HEP.    Eval: Patient reporting to PT  with "terrible back pain" in the middle of her back. She states that it's been worse for the past 6 months. She has difficulty waling upright when she first gets up, difficulty laying on her back, standing at the sink. Her pain sometimes radiates from the middle of her back to both sides. 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    PERTINENT HISTORY:  Relevant PMH includes: CAD, DM type 2, HTN, Arthritis, anginal pain, acute coronary syndrome  PAIN:  Are you having pain? Yes: NPRS scale: 4-9/10 Pain location: mid to lower back  Pain description: sharp  Aggravating factors: laying on her back, prolonged standing, bending down Relieving factors: brief relief from salonpas and heat pack   PRECAUTIONS: None  RED FLAGS: None   WEIGHT BEARING RESTRICTIONS: No  FALLS:  Has patient fallen in last 6 months? No  LIVING ENVIRONMENT: Lives with: lives with their family Lives in: House/apartment Stairs: Yes: Internal: several steps; can reach both Has following equipment at home: None  OCCUPATION: n/a   PLOF: Independent  PATIENT GOALS: To have less pain with daily activities   NEXT MD VISIT: unsure at this time  OBJECTIVE:  Note: Objective measures were completed at Evaluation unless otherwise noted.  DIAGNOSTIC FINDINGS:  No relevant imaging results available in epic; none included in referral    PATIENT SURVEYS:  FOTO 36 current, 49  predicted  COGNITION: Overall cognitive status: Within functional limits for tasks assessed     SENSATION: Not tested  POSTURE: moderate rounded shoulders, forward head, and increased thoracic kyphosis  PALPATION: Moderate-to-severe tenderness with palpation along bilateral lower thoracic and upper lumbar paraspinals, bilateral periscapular mm (rhomboids, middle trap)   THORACOLUMBAR ROM:   AROM eval  Thoracic Flexion 25%  Thoracic Extension 75%  Right Rotation 50%  Left Rotation 50%  Right Lateral Flexion   Left Lateral Flexion    Lumbar Flexion    Lumbar Extension    (Blank rows = not tested)  % of full AROM  UPPER EXTREMITY MMT: to be formally assessed at f/u visit    MMT Right eval Left eval  Shoulder flexion    Shoulder extension    Shoulder abduction    Shoulder adduction    Shoulder extension    Shoulder internal rotation    Shoulder external rotation    Middle trapezius    Lower trapezius    Elbow flexion    Elbow extension    Wrist flexion    Wrist extension    Wrist ulnar deviation    Wrist radial deviation    Wrist pronation    Wrist supination    Grip strength     (Blank rows = not tested)    TODAY'S TREATMENT:      [facility] Adult PT Treatment:                                                DATE: 08/04/2023  Therapeutic Exercise: NuStep level 6 x 6 minutes  Seated Thoracic ext on 1/2 foam roller - x20 W/ rotation x10 Supine Chest press with SA punch - 20x each - 3# weights Modified to 1 arm at a time to improve mechanics today  Supine fly with 3# weights - 2 x 10  Supine horizontal abd with GTB -  2 x 10  Sidelying open book 2 x 10 each side Standing Black TB row - 20x Standing Paloff press -  Green TB - 20x ea Standing Chops with red TB, x 15 each side       Reviewed STG and FOTO updated today   [facility] Adult PT Treatment:                                                DATE: 07/28/2023  Therapeutic Exercise: NuStep level 6 x 5 minutes   Thoracic ext on 1/2 foam roller in sitting - x20 W/ rotation x10 Chest press with SA punch in supine - 20x - 3# weights Cat camel - 2x10 Thread the needle - 10x ea Supine fly with 3# weights - 20x Supine horizontal abd with GTB - 20x Standing pball roll x 20  Supine scap setting - 2x10 Sidelying open book 2 x 10 each side Black TB row - 20x Paloff press -  Green TB - 15x ea   Consider chops                        Manual Therapy  CPA and UPA lower thoracic spine, pt in prone                                                                    [facility] Adult PT Treatment:                                                DATE: 07/13/2023  Therapeutic Exercise: NuStep level 3 x 5 minutes  Standing pball roll x 20  Sidelying open book 2 x 10 each side  Seated Cervical retraction 2 x 10  Seated scapular retraction 2 x 10  Seated pball roll, flex/left/right x 10 each  Seated lumbar spine extension with towel roll, 2 x 10   Reviewed HEP and ongoing patient education as noted below s    PATIENT EDUCATION:  Education details: reviewed initial home exercise program; discussion of POC, prognosis and goals for skilled PT; additional time for discussion and patient education related to 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    Person educated: Patient Education method: Explanation, Demonstration, and Handouts Education comprehension: verbalized understanding, returned demonstration, and needs further education  HOME EXERCISE PROGRAM: Access Code: [facility] URL: [email or web address] Date: 07/07/2023 Prepared by: Mauri Reading  Exercises - Standing Cervical Retraction  - 1 x daily - 7 x weekly - 2 sets - 10 reps - 3 sec hold - Seated Scapular Retraction  - 1 x daily - 7 x weekly - 2 sets - 10 reps - 3 sec hold - Sidelying Open Book  - 1 x daily - 7 x weekly - 1 sets - 10 reps -  sec hold - Shoulder Flexion Wall Walk  - 1 x daily - 7 x weekly - 1 sets  - 10 reps - 3 sec hold  ASSESSMENT:  CLINICAL IMPRESSION:  Patient continues to demonstrate improved tolerance of current therapeutic exercises. She also has improved navigation of bed mobility, including supine to sit. She requires occasional cueing for pacing and correct muscle activation with recent exercise progressions. We will continue to progress as tolerated.     OBJECTIVE IMPAIRMENTS: decreased activity tolerance, decreased endurance, decreased ROM, improper body mechanics, postural dysfunction, and pain.   ACTIVITY LIMITATIONS: carrying, lifting, bending, sitting, standing, sleeping, bed mobility, bathing, dressing, reach over head, and locomotion level  PARTICIPATION LIMITATIONS: meal prep, cleaning, laundry, interpersonal relationship, driving, and community activity  PERSONAL FACTORS: Age, Past/current experiences, Time since onset of injury/illness/exacerbation, and 3+ comorbidities: Relevant PMH includes: CAD, DM type 2, HTN, Arthritis, anginal pain, acute coronary syndrome  are also affecting patient's functional outcome.   REHAB POTENTIAL: Fair    CLINICAL DECISION MAKING: Stable/uncomplicated  EVALUATION COMPLEXITY: Low   GOALS: Goals reviewed with patient? Yes  SHORT TERM GOALS: Target date: 08/04/2023  Patient will be independent with initial home program for initial postural strengthening and thoracic mobility. Baseline: provided at eval  Goal status:MET as of 08/04/23  2.  Patient will demonstrate improved postural awareness for at least 15 minutes while seated without need for cueing from PT.   Baseline: moderate rounded shoulders, forward head, and increased thoracic kyphosis Goal status: MET as of 08/04/23  LONG TERM GOALS: Target date: 09/01/2023   Patient will report improved overall functional ability with FOTO score of 45 or greater.   Baseline: 36 08/04/23: 43 Goal status: PROGRESSING   2.  Patient will report no more than 6/10 worst pain severity  with ADLs and IADLs.  Baseline: 9/10 worst pain  Goal status: INITIAL  3.  Patient will demonstrate improved thoracolumbar mobility to at least 65-70% of full AROM.  Baseline:  AROM eval  Thoracic Flexion 25%  Thoracic Extension 75%  Right Rotation 50%  Left Rotation 50%   Goal status: INITIAL  4.  Patient will demonstrate ability to perform floor to waist lifting of at least 20# using appropriate body mechanics and with no more than minimal pain in order to safely perform normal daily/occupational tasks.   Baseline: unable Goal status: INITIAL  5.  Patient will demonstrate ability to perform overhead lifting of at least 5# using appropriate body mechanics and with no more than minimal pain in order to safely perform normal daily/occupational tasks.   Baseline: unable  Goal status: INITIAL   PLAN:  PT FREQUENCY: 1-2x/week  PT DURATION: 8 weeks  PLANNED INTERVENTIONS: 97146- PT Re-evaluation, 97110-Therapeutic exercises, 97530- Therapeutic activity, 97112- Neuromuscular re-education, 97535- Self Care, 16109- Manual therapy, 97014- Electrical stimulation (unattended), [id number]- Electrical stimulation (manual), Dry Needling, Cryotherapy, and Moist heat.  PLAN FOR NEXT SESSION: assess: lumbar AROM, UE/periscapular strength; interventions: TS/LS mobility, postural endurance, UE aerobic activity, wall slides, manual therapy and modalities as indicated    Mauri Reading, PT, DPT   08/04/2023, 11:05 AM

## 2023-08-07 ENCOUNTER — Ambulatory Visit
Admission: RE | Admit: 2023-08-07 | Discharge: 2023-08-07 | Disposition: A | Discharge status: Home / Self Care | Payer: 59 | Source: Ambulatory Visit | Attending: Physician Assistant | Admitting: Physician Assistant

## 2023-08-07 DIAGNOSIS — M48061 Spinal stenosis, lumbar region without neurogenic claudication: Secondary | ICD-10-CM

## 2023-08-07 DIAGNOSIS — M5126 Other intervertebral disc displacement, lumbar region: Secondary | ICD-10-CM

## 2023-08-09 ENCOUNTER — Ambulatory Visit: Payer: 59

## 2023-08-09 DIAGNOSIS — M546 Pain in thoracic spine: Secondary | ICD-10-CM | POA: Diagnosis not present

## 2023-08-09 DIAGNOSIS — M5459 Other low back pain: Secondary | ICD-10-CM

## 2023-08-09 NOTE — Therapy (Signed)
OUTPATIENT PHYSICAL THERAPY NOTE   Patient Name: Kim Gibson MRN: [id number] DOB:October 06, 1950, 72 y.o., female Today's Date: 08/09/2023  END OF SESSION:  PT End of Session - 08/09/23 1126     Visit Number 7    Number of Visits 17    Date for PT Re-Evaluation 09/01/23    Authorization Type [employer] MCR    PT Start Time 1126    PT Stop Time 1206    PT Time Calculation (min) 40 min    Activity Tolerance Patient tolerated treatment well    Behavior During Therapy [facility] for tasks assessed/performed                Past Medical History:  Diagnosis Date   Anginal pain (HCC) 02/10/2017   AT REST   Arthritis    Coronary artery disease    Diabetes mellitus without complication (HCC)    Hyperlipemia    Hypertension    Insomnia    Past Surgical History:  Procedure Laterality Date   CARDIAC CATHETERIZATION N/A 09/24/2016   Procedure: Left Heart Cath and Coronary Angiography;  Surgeon: Orpah Cobb, MD;  Location: [facility];  Service: Cardiovascular;  Laterality: N/A;   CARDIOVASCULAR STRESS TEST  11/29/2019   CESAREAN SECTION     CORONARY ANGIOGRAM  2005   LEFT HEART CATH AND CORONARY ANGIOGRAPHY N/A 04/06/2023   Procedure: LEFT HEART CATH AND CORONARY ANGIOGRAPHY;  Surgeon: Orpah Cobb, MD;  Location: [facility];  Service: Cardiovascular;  Laterality: N/A;   Patient Active Problem List   Diagnosis Date Noted   Acute coronary syndrome (HCC) 11/28/2019   Chest pain 02/10/2017   Precordial chest pain 09/24/2016   Angina at rest [facility]) 07/30/2015   Dysphagia 07/30/2015   Heart palpitations 07/30/2015   Diabetes type 2, uncontrolled 07/30/2015   HTN (hypertension) 07/30/2015   HLD (hyperlipidemia) 07/30/2015   Diabetes mellitus with complication (HCC)     PCP: Mitchell Heir, DO  REFERRING PROVIDER: Margart Sickles, PA-C  REFERRING DIAG: Thoraco-Lumbar Pain   Rationale for Evaluation and Treatment: Rehabilitation  THERAPY DIAG:  Pain  in thoracic spine  Other low back pain  ONSET DATE: April 2024.   SUBJECTIVE:                                                                                                                                                                                           SUBJECTIVE STATEMENT:  08/09/2023 Patient reporting that she is doing "alright" this morning and has some tightness on the left side of her lower back.    Eval: Patient reporting to PT with "terrible  back pain" in the middle of her back. She states that it's been worse for the past 6 months. She has difficulty waling upright when she first gets up, difficulty laying on her back, standing at the sink. Her pain sometimes radiates from the middle of her back to both sides. 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    PERTINENT HISTORY:  Relevant PMH includes: CAD, DM type 2, HTN, Arthritis, anginal pain, acute coronary syndrome  PAIN:  Are you having pain? Yes: NPRS scale: 4-9/10 Pain location: mid to lower back  Pain description: sharp  Aggravating factors: laying on her back, prolonged standing, bending down Relieving factors: brief relief from salonpas and heat pack   PRECAUTIONS: None  RED FLAGS: None   WEIGHT BEARING RESTRICTIONS: No  FALLS:  Has patient fallen in last 6 months? No  LIVING ENVIRONMENT: Lives with: lives with their family Lives in: House/apartment Stairs: Yes: Internal: several steps; can reach both Has following equipment at home: None  OCCUPATION: n/a   PLOF: Independent  PATIENT GOALS: To have less pain with daily activities   NEXT MD VISIT: unsure at this time  OBJECTIVE:  Note: Objective measures were completed at Evaluation unless otherwise noted.  DIAGNOSTIC FINDINGS:  No relevant imaging results available in epic; none included in referral    PATIENT SURVEYS:  FOTO 36 current, 49 predicted  COGNITION: Overall  cognitive status: Within functional limits for tasks assessed     SENSATION: Not tested  POSTURE: moderate rounded shoulders, forward head, and increased thoracic kyphosis  PALPATION: Moderate-to-severe tenderness with palpation along bilateral lower thoracic and upper lumbar paraspinals, bilateral periscapular mm (rhomboids, middle trap)   THORACOLUMBAR ROM:   AROM eval  Thoracic Flexion 25%  Thoracic Extension 75%  Right Rotation 50%  Left Rotation 50%  Right Lateral Flexion   Left Lateral Flexion    Lumbar Flexion    Lumbar Extension    (Blank rows = not tested)  % of full AROM  UPPER EXTREMITY MMT: to be formally assessed at f/u visit    MMT Right eval Left eval  Shoulder flexion    Shoulder extension    Shoulder abduction    Shoulder adduction    Shoulder extension    Shoulder internal rotation    Shoulder external rotation    Middle trapezius    Lower trapezius    Elbow flexion    Elbow extension    Wrist flexion    Wrist extension    Wrist ulnar deviation    Wrist radial deviation    Wrist pronation    Wrist supination    Grip strength     (Blank rows = not tested)    TODAY'S TREATMENT:       [facility] Adult PT Treatment:                                                DATE: 08/09/2023  Therapeutic Exercise: NuStep level 4 x 8 minutes  Supine Chest press with SA punch - 20x each - 4# weights Modified to 1 arm at a time to improve mechanics today  Supine fly with 4# weights - 2 x 10  Sidelying open book x 15 each side Standing Black TB row - 20x Standing Paloff press -  Green TB - 20x ea Standing Chops with red TB, x 15 each side    Updated and reviewed HEP   Supine SLR, 2 x 10 each LE   Modalities: Moist Hot Pack applied to LS, concurrent with NuStep at start of session to maximize analgesic effects    [facility] Adult PT Treatment:                                                DATE: 08/04/2023  Therapeutic Exercise: NuStep level 6 x 6 minutes   Seated Thoracic ext on 1/2 foam roller - x20 W/ rotation x10 Supine Chest press with SA punch - 20x each - 3# weights Modified to 1 arm at a time to improve mechanics today  Supine fly with 3# weights - 2 x 10  Supine horizontal abd with GTB - 2 x 10  Sidelying open book 2 x 10 each side Standing Black TB row - 20x Standing Paloff press -  Green TB - 20x ea Standing Chops with red TB, x 15 each side       Reviewed STG and FOTO updated today   [facility] Adult PT Treatment:                                                DATE: 07/28/2023  Therapeutic Exercise: NuStep level 6 x 5 minutes  Thoracic ext on 1/2 foam roller in sitting - x20 W/ rotation x10 Chest press with SA punch in supine - 20x - 3# weights Cat camel - 2x10 Thread the needle - 10x ea Supine fly with 3# weights - 20x Supine horizontal abd with GTB - 20x Standing pball roll x 20  Supine scap setting - 2x10 Sidelying open book 2 x 10 each side Black TB row - 20x Paloff press -  Green TB - 15x ea   Consider chops                        Manual Therapy  CPA and UPA lower thoracic spine, pt in prone                                                                     PATIENT EDUCATION:  Education details: reviewed initial home exercise program; discussion of POC, prognosis and goals for skilled PT; additional time for discussion and patient education related to 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    Person educated: Patient Education method: Explanation, Demonstration, and Handouts Education comprehension: verbalized understanding, returned demonstration, and needs further education  HOME EXERCISE PROGRAM: Access Code: [facility] URL: [email or web address] Date: 08/09/2023 Prepared by: Mauri Reading  Exercises - Standing Cervical Retraction  - 1 x daily - 7 x weekly - 2 sets - 10 reps - 3 sec hold - Seated Scapular Retraction  - 1 x daily - 7 x weekly - 2 sets - 10 reps - 3  sec hold - Sidelying Open Book  - 1 x  daily - 7 x weekly - 1 sets - 10 reps -  sec hold - Standing Shoulder Row with Anchored Resistance  - 1 x daily - 7 x weekly - 2 sets - 10 reps - 3 sec hold - Standing Anti-Rotation Press with Anchored Resistance  - 1 x daily - 7 x weekly - 2 sets - 10 reps - 3 sec hold  ASSESSMENT:  CLINICAL IMPRESSION: Patient responded well to skilled PT today, including additional core strengthening activities. She continues to have the most difficulty with serratus punches and required frequent verbal and tactile cues to improve muscle activation with this exercise. She will benefit from progression of isometric core strengthening activities.      OBJECTIVE IMPAIRMENTS: decreased activity tolerance, decreased endurance, decreased ROM, improper body mechanics, postural dysfunction, and pain.   ACTIVITY LIMITATIONS: carrying, lifting, bending, sitting, standing, sleeping, bed mobility, bathing, dressing, reach over head, and locomotion level  PARTICIPATION LIMITATIONS: meal prep, cleaning, laundry, interpersonal relationship, driving, and community activity  PERSONAL FACTORS: Age, Past/current experiences, Time since onset of injury/illness/exacerbation, and 3+ comorbidities: Relevant PMH includes: CAD, DM type 2, HTN, Arthritis, anginal pain, acute coronary syndrome  are also affecting patient's functional outcome.   REHAB POTENTIAL: Fair    CLINICAL DECISION MAKING: Stable/uncomplicated  EVALUATION COMPLEXITY: Low   GOALS: Goals reviewed with patient? Yes  SHORT TERM GOALS: Target date: 08/04/2023  Patient will be independent with initial home program for initial postural strengthening and thoracic mobility. Baseline: provided at eval  Goal status:MET as of 08/04/23  2.  Patient will demonstrate improved postural awareness for at least 15 minutes while seated without need for cueing from PT.   Baseline: moderate rounded shoulders, forward head, and  increased thoracic kyphosis Goal status: MET as of 08/04/23  LONG TERM GOALS: Target date: 09/01/2023   Patient will report improved overall functional ability with FOTO score of 45 or greater.   Baseline: 36 08/04/23: 43 Goal status: PROGRESSING   2.  Patient will report no more than 6/10 worst pain severity with ADLs and IADLs.  Baseline: 9/10 worst pain  Goal status: INITIAL  3.  Patient will demonstrate improved thoracolumbar mobility to at least 65-70% of full AROM.  Baseline:  AROM eval  Thoracic Flexion 25%  Thoracic Extension 75%  Right Rotation 50%  Left Rotation 50%   Goal status: INITIAL  4.  Patient will demonstrate ability to perform floor to waist lifting of at least 20# using appropriate body mechanics and with no more than minimal pain in order to safely perform normal daily/occupational tasks.   Baseline: unable Goal status: INITIAL  5.  Patient will demonstrate ability to perform overhead lifting of at least 5# using appropriate body mechanics and with no more than minimal pain in order to safely perform normal daily/occupational tasks.   Baseline: unable  Goal status: INITIAL   PLAN:  PT FREQUENCY: 1-2x/week  PT DURATION: 8 weeks  PLANNED INTERVENTIONS: 97146- PT Re-evaluation, 97110-Therapeutic exercises, 97530- Therapeutic activity, 97112- Neuromuscular re-education, 97535- Self Care, 16109- Manual therapy, 97014- Electrical stimulation (unattended), [id number]- Electrical stimulation (manual), Dry Needling, Cryotherapy, and Moist heat.  PLAN FOR NEXT SESSION: assess: lumbar AROM, UE/periscapular strength; interventions: TS/LS mobility, postural endurance, UE aerobic activity, wall slides, manual therapy and modalities as indicated    Mauri Reading, PT, DPT   08/09/2023, 11:42 AM

## 2023-08-16 ENCOUNTER — Encounter: Payer: Self-pay | Admitting: Physical Therapy

## 2023-08-16 ENCOUNTER — Ambulatory Visit: Payer: 59 | Admitting: Physical Therapy

## 2023-08-16 DIAGNOSIS — M546 Pain in thoracic spine: Secondary | ICD-10-CM | POA: Diagnosis not present

## 2023-08-16 DIAGNOSIS — M5459 Other low back pain: Secondary | ICD-10-CM

## 2023-08-16 NOTE — Therapy (Signed)
OUTPATIENT PHYSICAL THERAPY NOTE   Patient Name: Kim Gibson MRN: [id number] DOB:27-Sep-1951, 72 y.o., female Today's Date: 08/16/2023  END OF SESSION:  PT End of Session - 08/16/23 1131     Visit Number 8    Number of Visits 17    Date for PT Re-Evaluation 09/01/23    Authorization Type [employer] MCR    PT Start Time 1130    PT Stop Time 1212    PT Time Calculation (min) 42 min    Activity Tolerance Patient tolerated treatment well    Behavior During Therapy [facility] for tasks assessed/performed                Past Medical History:  Diagnosis Date   Anginal pain (HCC) 02/10/2017   AT REST   Arthritis    Coronary artery disease    Diabetes mellitus without complication (HCC)    Hyperlipemia    Hypertension    Insomnia    Past Surgical History:  Procedure Laterality Date   CARDIAC CATHETERIZATION N/A 09/24/2016   Procedure: Left Heart Cath and Coronary Angiography;  Surgeon: Orpah Cobb, MD;  Location: [facility];  Service: Cardiovascular;  Laterality: N/A;   CARDIOVASCULAR STRESS TEST  11/29/2019   CESAREAN SECTION     CORONARY ANGIOGRAM  2005   LEFT HEART CATH AND CORONARY ANGIOGRAPHY N/A 04/06/2023   Procedure: LEFT HEART CATH AND CORONARY ANGIOGRAPHY;  Surgeon: Orpah Cobb, MD;  Location: [facility];  Service: Cardiovascular;  Laterality: N/A;   Patient Active Problem List   Diagnosis Date Noted   Acute coronary syndrome (HCC) 11/28/2019   Chest pain 02/10/2017   Precordial chest pain 09/24/2016   Angina at rest [facility]) 07/30/2015   Dysphagia 07/30/2015   Heart palpitations 07/30/2015   Diabetes type 2, uncontrolled 07/30/2015   HTN (hypertension) 07/30/2015   HLD (hyperlipidemia) 07/30/2015   Diabetes mellitus with complication (HCC)     PCP: Mitchell Heir, DO  REFERRING PROVIDER: Margart Sickles, PA-C  REFERRING DIAG: Thoraco-Lumbar Pain   Rationale for Evaluation and Treatment: Rehabilitation  THERAPY DIAG:  Pain  in thoracic spine  Other low back pain  ONSET DATE: April 2024.   SUBJECTIVE:                                                                                                                                                                                           SUBJECTIVE STATEMENT:  08/16/2023 Pt reports that she continues to have pain, particularly have long periods of lying down.   Eval: Patient reporting to PT with "terrible back pain" in the middle of her  back. She states that it's been worse for the past 6 months. She has difficulty waling upright when she first gets up, difficulty laying on her back, standing at the sink. Her pain sometimes radiates from the middle of her back to both sides. 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    PERTINENT HISTORY:  Relevant PMH includes: CAD, DM type 2, HTN, Arthritis, anginal pain, acute coronary syndrome  PAIN:  Are you having pain? Yes: NPRS scale: 4-9/10 Pain location: mid to lower back  Pain description: sharp  Aggravating factors: laying on her back, prolonged standing, bending down Relieving factors: brief relief from salonpas and heat pack   PRECAUTIONS: None  RED FLAGS: None   WEIGHT BEARING RESTRICTIONS: No  FALLS:  Has patient fallen in last 6 months? No  LIVING ENVIRONMENT: Lives with: lives with their family Lives in: House/apartment Stairs: Yes: Internal: several steps; can reach both Has following equipment at home: None  OCCUPATION: n/a   PLOF: Independent  PATIENT GOALS: To have less pain with daily activities   NEXT MD VISIT: unsure at this time  OBJECTIVE:  Note: Objective measures were completed at Evaluation unless otherwise noted.  DIAGNOSTIC FINDINGS:  No relevant imaging results available in epic; none included in referral    PATIENT SURVEYS:  FOTO 36 current, 49 predicted  COGNITION: Overall cognitive status: Within  functional limits for tasks assessed     SENSATION: Not tested  POSTURE: moderate rounded shoulders, forward head, and increased thoracic kyphosis  PALPATION: Moderate-to-severe tenderness with palpation along bilateral lower thoracic and upper lumbar paraspinals, bilateral periscapular mm (rhomboids, middle trap)   THORACOLUMBAR ROM:   AROM eval  Thoracic Flexion 25%  Thoracic Extension 75%  Right Rotation 50%  Left Rotation 50%  Right Lateral Flexion   Left Lateral Flexion    Lumbar Flexion    Lumbar Extension    (Blank rows = not tested)  % of full AROM  UPPER EXTREMITY MMT: to be formally assessed at f/u visit    MMT Right eval Left eval  Shoulder flexion    Shoulder extension    Shoulder abduction    Shoulder adduction    Shoulder extension    Shoulder internal rotation    Shoulder external rotation    Middle trapezius    Lower trapezius    Elbow flexion    Elbow extension    Wrist flexion    Wrist extension    Wrist ulnar deviation    Wrist radial deviation    Wrist pronation    Wrist supination    Grip strength     (Blank rows = not tested)    TODAY'S TREATMENT:      [facility] Adult PT Treatment:                                                DATE: 08/16/2023  Therapeutic Exercise:  Towel roll under tx spine for supine exercises  NuStep level 6 x 5 minutes  Thoracic ext on 1/2 foam roller in sitting - x20 W/ rotation x10 Chest press with SA punch in supine - 20x - 4# weights Supine shoulder rolls  Supine fly with 4# weights - 20x Prone T, Prone W - 2x10 ea Standing pball roll x 20  Low  row - 2x10 - 15# High row - 2x10 - 15#                 Manual Therapy  CPA and UPA lower thoracic spine, pt in prone   [facility] Adult PT Treatment:                                                DATE: 08/09/2023  Therapeutic Exercise: NuStep level 4 x 8 minutes  Supine Chest press with SA punch - 20x each - 4# weights Modified to 1 arm at a time to improve  mechanics today  Supine fly with 4# weights - 2 x 10  Sidelying open book x 15 each side Standing Black TB row - 20x Standing Paloff press -  Green TB - 20x ea Standing Chops with red TB, x 15 each side    Updated and reviewed HEP   Supine SLR, 2 x 10 each LE   Modalities: Moist Hot Pack applied to LS, concurrent with NuStep at start of session to maximize analgesic effects    [facility] Adult PT Treatment:                                                DATE: 08/04/2023  Therapeutic Exercise: NuStep level 6 x 6 minutes  Seated Thoracic ext on 1/2 foam roller - x20 W/ rotation x10 Supine Chest press with SA punch - 20x each - 3# weights Modified to 1 arm at a time to improve mechanics today  Supine fly with 3# weights - 2 x 10  Supine horizontal abd with GTB - 2 x 10  Sidelying open book 2 x 10 each side Standing Black TB row - 20x Standing Paloff press -  Green TB - 20x ea Standing Chops with red TB, x 15 each side       Reviewed STG and FOTO updated today   [facility] Adult PT Treatment:                                                DATE: 07/28/2023  Therapeutic Exercise: NuStep level 6 x 5 minutes  Thoracic ext on 1/2 foam roller in sitting - x20 W/ rotation x10 Chest press with SA punch in supine - 20x - 3# weights Cat camel - 2x10 Thread the needle - 10x ea Supine fly with 3# weights - 20x Supine horizontal abd with GTB - 20x Standing pball roll x 20  Supine scap setting - 2x10 Sidelying open book 2 x 10 each side Black TB row - 20x Paloff press -  Green TB - 15x ea   Consider chops                        Manual Therapy  CPA and UPA lower thoracic spine, pt in prone  PATIENT EDUCATION:  Education details: reviewed initial home exercise program; discussion of POC, prognosis and goals for skilled PT; additional time for discussion and patient education related to 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    Person educated: Patient Education method: Explanation, Demonstration, and Handouts Education comprehension: verbalized understanding, returned demonstration, and needs further education  HOME EXERCISE PROGRAM: Access Code: [facility] URL: [email or web address] Date: 08/09/2023 Prepared by: Mauri Reading  Exercises - Standing Cervical Retraction  - 1 x daily - 7 x weekly - 2 sets - 10 reps - 3 sec hold - Seated Scapular Retraction  - 1 x daily - 7 x weekly - 2 sets - 10 reps - 3 sec hold - Sidelying Open Book  - 1 x daily - 7 x weekly - 1 sets - 10 reps -  sec hold - Standing Shoulder Row with Anchored Resistance  - 1 x daily - 7 x weekly - 2 sets - 10 reps - 3 sec hold - Standing Anti-Rotation Press with Anchored Resistance  - 1 x daily - 7 x weekly - 2 sets - 10 reps - 3 sec hold  ASSESSMENT:  CLINICAL IMPRESSION: Wednesday tolerated session well with no adverse reaction.  Pt arrives than higher than normal baseline pain after packing up boxes yesterday.  She was able to complete all listed exercise and left with significant pain reduction following therex and manual.     OBJECTIVE IMPAIRMENTS: decreased activity tolerance, decreased endurance, decreased ROM, improper body mechanics, postural dysfunction, and pain.   ACTIVITY LIMITATIONS: carrying, lifting, bending, sitting, standing, sleeping, bed mobility, bathing, dressing, reach over head, and locomotion level  PARTICIPATION LIMITATIONS: meal prep, cleaning, laundry, interpersonal relationship, driving, and community activity  PERSONAL FACTORS: Age, Past/current experiences, Time since onset of injury/illness/exacerbation, and 3+ comorbidities: Relevant PMH includes: CAD, DM type 2, HTN, Arthritis, anginal pain, acute coronary syndrome  are also affecting patient's functional outcome.   REHAB POTENTIAL: Fair    CLINICAL DECISION MAKING: Stable/uncomplicated  EVALUATION COMPLEXITY:  Low   GOALS: Goals reviewed with patient? Yes  SHORT TERM GOALS: Target date: 08/04/2023  Patient will be independent with initial home program for initial postural strengthening and thoracic mobility. Baseline: provided at eval  Goal status:MET as of 08/04/23  2.  Patient will demonstrate improved postural awareness for at least 15 minutes while seated without need for cueing from PT.   Baseline: moderate rounded shoulders, forward head, and increased thoracic kyphosis Goal status: MET as of 08/04/23  LONG TERM GOALS: Target date: 09/01/2023   Patient will report improved overall functional ability with FOTO score of 45 or greater.   Baseline: 36 08/04/23: 43 Goal status: PROGRESSING   2.  Patient will report no more than 6/10 worst pain severity with ADLs and IADLs.  Baseline: 9/10 worst pain  Goal status: INITIAL  3.  Patient will demonstrate improved thoracolumbar mobility to at least 65-70% of full AROM.  Baseline:  AROM eval  Thoracic Flexion 25%  Thoracic Extension 75%  Right Rotation 50%  Left Rotation 50%   Goal status: INITIAL  4.  Patient will demonstrate ability to perform floor to waist lifting of at least 20# using appropriate body mechanics and with no more than minimal pain in order to safely perform normal daily/occupational tasks.   Baseline: unable Goal status: INITIAL  5.  Patient will demonstrate ability to perform overhead lifting of at least 5# using appropriate body mechanics and with no more than minimal pain in order to  safely perform normal daily/occupational tasks.   Baseline: unable  Goal status: INITIAL   PLAN:  PT FREQUENCY: 1-2x/week  PT DURATION: 8 weeks  PLANNED INTERVENTIONS: 97146- PT Re-evaluation, 97110-Therapeutic exercises, 97530- Therapeutic activity, 97112- Neuromuscular re-education, 97535- Self Care, [id number]- Manual therapy, 97014- Electrical stimulation (unattended), [id number]- Electrical stimulation (manual), Dry Needling,  Cryotherapy, and Moist heat.  PLAN FOR NEXT SESSION: assess: lumbar AROM, UE/periscapular strength; interventions: TS/LS mobility, postural endurance, UE aerobic activity, wall slides, manual therapy and modalities as indicated    Kimberlee Nearing Avan Gullett PT  08/16/2023, 12:57 PM

## 2023-08-18 ENCOUNTER — Ambulatory Visit: Payer: 59

## 2023-08-18 DIAGNOSIS — M5459 Other low back pain: Secondary | ICD-10-CM

## 2023-08-18 DIAGNOSIS — M546 Pain in thoracic spine: Secondary | ICD-10-CM

## 2023-08-18 NOTE — Therapy (Signed)
OUTPATIENT PHYSICAL THERAPY NOTE   Patient Name: Kim Gibson MRN: [id number] DOB:December 28, 1950, 72 y.o., female Today's Date: 08/18/2023  END OF SESSION:  PT End of Session - 08/18/23 1134     Visit Number 9    Number of Visits 17    Date for PT Re-Evaluation 09/01/23    Authorization Type [employer] MCR    PT Start Time 1135    PT Stop Time 1213    PT Time Calculation (min) 38 min    Activity Tolerance Patient tolerated treatment well    Behavior During Therapy [facility] for tasks assessed/performed                Past Medical History:  Diagnosis Date   Anginal pain (HCC) 02/10/2017   AT REST   Arthritis    Coronary artery disease    Diabetes mellitus without complication (HCC)    Hyperlipemia    Hypertension    Insomnia    Past Surgical History:  Procedure Laterality Date   CARDIAC CATHETERIZATION N/A 09/24/2016   Procedure: Left Heart Cath and Coronary Angiography;  Surgeon: Orpah Cobb, MD;  Location: [facility] IN[facility];  Service: Cardiovascular;  Laterality: N/A;   CARDIOVASCULAR STRESS TEST  11/29/2019   CESAREAN SECTION     CORONARY ANGIOGRAM  2005   LEFT HEART CATH AND CORONARY ANGIOGRAPHY N/A 04/06/2023   Procedure: LEFT HEART CATH AND CORONARY ANGIOGRAPHY;  Surgeon: Orpah Cobb, MD;  Location: [facility] INV[facility] CV LAB;  Service: Cardiovascular;  Laterality: N/A;   Patient Active Problem List   Diagnosis Date Noted   Acute coronary syndrome (HCC) 11/28/2019   Chest pain 02/10/2017   Precordial chest pain 09/24/2016   Angina at rest [facility]) 07/30/2015   Dysphagia 07/30/2015   Heart palpitations 07/30/2015   Diabetes type 2, uncontrolled 07/30/2015   HTN (hypertension) 07/30/2015   HLD (hyperlipidemia) 07/30/2015   Diabetes mellitus with complication (HCC)     PCP: Mitchell Heir, DO  REFERRING PROVIDER: Margart Sickles, PA-C  REFERRING DIAG: Thoraco-Lumbar Pain   Rationale for Evaluation and Treatment: Rehabilitation  THERAPY DIAG:  Pain  in thoracic spine  Other low back pain  ONSET DATE: April 2024.   SUBJECTIVE:                                                                                                                                                                                           SUBJECTIVE STATEMENT:  08/18/2023 Patient reports that she is not in a lot of pain at the beginning of today's session. However, she had pain that was located near her ribs yesterday.   Eval:  Patient reporting to PT with "terrible back pain" in the middle of her back. She states that it's been worse for the past 6 months. She has difficulty walking upright when she first gets up, difficulty laying on her back, standing at the sink. Her pain sometimes radiates from the middle of her back to both sides. 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    PERTINENT HISTORY:  Relevant PMH includes: CAD, DM type 2, HTN, Arthritis, anginal pain, acute coronary syndrome  PAIN:  Are you having pain? Yes: NPRS scale: 4-9/10 Pain location: mid to lower back  Pain description: sharp  Aggravating factors: laying on her back, prolonged standing, bending down Relieving factors: brief relief from salonpas and heat pack   PRECAUTIONS: None  RED FLAGS: None   WEIGHT BEARING RESTRICTIONS: No  FALLS:  Has patient fallen in last 6 months? No  LIVING ENVIRONMENT: Lives with: lives with their family Lives in: House/apartment Stairs: Yes: Internal: several steps; can reach both Has following equipment at home: None  OCCUPATION: n/a   PLOF: Independent  PATIENT GOALS: To have less pain with daily activities   NEXT MD VISIT: unsure at this time  OBJECTIVE:  Note: Objective measures were completed at Evaluation unless otherwise noted.  DIAGNOSTIC FINDINGS:  No relevant imaging results available in epic; none included in referral    PATIENT SURVEYS:  FOTO 36 current, 49  predicted  COGNITION: Overall cognitive status: Within functional limits for tasks assessed     SENSATION: Not tested  POSTURE: moderate rounded shoulders, forward head, and increased thoracic kyphosis  PALPATION: Moderate-to-severe tenderness with palpation along bilateral lower thoracic and upper lumbar paraspinals, bilateral periscapular mm (rhomboids, middle trap)   THORACOLUMBAR ROM:   AROM eval  Thoracic Flexion 25%  Thoracic Extension 75%  Right Rotation 50%  Left Rotation 50%  Right Lateral Flexion   Left Lateral Flexion    Lumbar Flexion    Lumbar Extension    (Blank rows = not tested)  % of full AROM  UPPER EXTREMITY MMT: to be formally assessed at f/u visit    MMT Right eval Left eval  Shoulder flexion    Shoulder extension    Shoulder abduction    Shoulder adduction    Shoulder extension    Shoulder internal rotation    Shoulder external rotation    Middle trapezius    Lower trapezius    Elbow flexion    Elbow extension    Wrist flexion    Wrist extension    Wrist ulnar deviation    Wrist radial deviation    Wrist pronation    Wrist supination    Grip strength     (Blank rows = not tested)    TODAY'S TREATMENT:      [facility] Adult PT Treatment:                                                DATE: 08/18/2023  Therapeutic Exercise: NuStep level 5 x 5 minutes  Chest press with SA punch in supine - 20x - 4# weights Supine shoulder circles CW/CCW x 15 each  Supine fly with 4# weights, 2 x 10  Standing pball roll x 20  Standing Chops with green TB, 2 x 10  Low row -  2x10 - 20# High row - 2x10 - 20#                 Manual Therapy CPA lower thoracic spine, pt in prone  [facility] Adult PT Treatment:                                                DATE: 08/16/2023  Therapeutic Exercise:  Towel roll under tx spine for supine exercises  NuStep level 6 x 5 minutes  Thoracic ext on 1/2 foam roller in sitting - x20 W/ rotation x10 Chest press with  SA punch in supine - 20x - 4# weights Supine shoulder rolls  Supine fly with 4# weights - 20x Prone T, Prone W - 2x10 ea Standing pball roll x 20  Low row - 2x10 - 15# High row - 2x10 - 15#                 Manual Therapy  CPA and UPA lower thoracic spine, pt in prone   [facility] Adult PT Treatment:                                                DATE: 08/09/2023  Therapeutic Exercise: NuStep level 4 x 8 minutes  Supine Chest press with SA punch - 20x each - 4# weights Modified to 1 arm at a time to improve mechanics today  Supine fly with 4# weights - 2 x 10  Sidelying open book x 15 each side Standing Black TB row - 20x Standing Paloff press -  Green TB - 20x ea Standing Chops with red TB, x 15 each side    Updated and reviewed HEP   Supine SLR, 2 x 10 each LE   Modalities: Moist Hot Pack applied to LS, concurrent with NuStep at start of session to maximize analgesic effects                                                                        PATIENT EDUCATION:  Education details: reviewed initial home exercise program; discussion of POC, prognosis and goals for skilled PT; additional time for discussion and patient education related to 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    Person educated: Patient Education method: Explanation, Demonstration, and Handouts Education comprehension: verbalized understanding, returned demonstration, and needs further education  HOME EXERCISE PROGRAM: Access Code: [facility] URL: [email or web address] Date: 08/09/2023 Prepared by: Mauri Reading  Exercises - Standing Cervical Retraction  - 1 x daily - 7 x weekly - 2 sets - 10 reps - 3 sec hold - Seated Scapular Retraction  - 1 x daily - 7 x weekly - 2 sets - 10 reps - 3 sec hold - Sidelying Open Book  - 1 x daily - 7 x weekly - 1 sets - 10 reps -  sec hold - Standing Shoulder Row with Anchored Resistance  - 1 x daily - 7 x weekly - 2 sets - 10 reps -  3  sec hold - Standing Anti-Rotation Press with Anchored Resistance  - 1 x daily - 7 x weekly - 2 sets - 10 reps - 3 sec hold  ASSESSMENT:  CLINICAL IMPRESSION: Syliva tolerated session well with no adverse reaction.  She denies any worsening of symptoms and end of session. She continues to report poor compliance of prescribed HEP, which she was encouraged to work on at her next visit. She continues to require frequent verbal and tactile cues to improve muscle activation patterns with exercises.     OBJECTIVE IMPAIRMENTS: decreased activity tolerance, decreased endurance, decreased ROM, improper body mechanics, postural dysfunction, and pain.   ACTIVITY LIMITATIONS: carrying, lifting, bending, sitting, standing, sleeping, bed mobility, bathing, dressing, reach over head, and locomotion level  PARTICIPATION LIMITATIONS: meal prep, cleaning, laundry, interpersonal relationship, driving, and community activity  PERSONAL FACTORS: Age, Past/current experiences, Time since onset of injury/illness/exacerbation, and 3+ comorbidities: Relevant PMH includes: CAD, DM type 2, HTN, Arthritis, anginal pain, acute coronary syndrome  are also affecting patient's functional outcome.   REHAB POTENTIAL: Fair    CLINICAL DECISION MAKING: Stable/uncomplicated  EVALUATION COMPLEXITY: Low   GOALS: Goals reviewed with patient? Yes  SHORT TERM GOALS: Target date: 08/04/2023  Patient will be independent with initial home program for initial postural strengthening and thoracic mobility. Baseline: provided at eval  Goal status:MET as of 08/04/23  2.  Patient will demonstrate improved postural awareness for at least 15 minutes while seated without need for cueing from PT.   Baseline: moderate rounded shoulders, forward head, and increased thoracic kyphosis Goal status: MET as of 08/04/23  LONG TERM GOALS: Target date: 09/01/2023   Patient will report improved overall functional ability with FOTO score of 45  or greater.   Baseline: 36 08/04/23: 43 Goal status: PROGRESSING   2.  Patient will report no more than 6/10 worst pain severity with ADLs and IADLs.  Baseline: 9/10 worst pain  Goal status: INITIAL  3.  Patient will demonstrate improved thoracolumbar mobility to at least 65-70% of full AROM.  Baseline:  AROM eval  Thoracic Flexion 25%  Thoracic Extension 75%  Right Rotation 50%  Left Rotation 50%   Goal status: INITIAL  4.  Patient will demonstrate ability to perform floor to waist lifting of at least 20# using appropriate body mechanics and with no more than minimal pain in order to safely perform normal daily/occupational tasks.   Baseline: unable Goal status: INITIAL  5.  Patient will demonstrate ability to perform overhead lifting of at least 5# using appropriate body mechanics and with no more than minimal pain in order to safely perform normal daily/occupational tasks.   Baseline: unable  Goal status: INITIAL   PLAN:  PT FREQUENCY: 1-2x/week  PT DURATION: 8 weeks  PLANNED INTERVENTIONS: 97146- PT Re-evaluation, 97110-Therapeutic exercises, 97530- Therapeutic activity, 97112- Neuromuscular re-education, 97535- Self Care, 95621- Manual therapy, 97014- Electrical stimulation (unattended), [id number]- Electrical stimulation (manual), Dry Needling, Cryotherapy, and Moist heat.  PLAN FOR NEXT SESSION: assess: lumbar AROM, UE/periscapular strength; interventions: TS/LS mobility, postural endurance, UE aerobic activity, wall slides, manual therapy and modalities as indicated    Mauri Reading, PT, DPT    08/18/2023, 12:13 PM

## 2023-08-23 ENCOUNTER — Ambulatory Visit: Payer: 59

## 2023-08-23 DIAGNOSIS — M546 Pain in thoracic spine: Secondary | ICD-10-CM | POA: Diagnosis not present

## 2023-08-23 DIAGNOSIS — M5459 Other low back pain: Secondary | ICD-10-CM

## 2023-08-23 NOTE — Therapy (Signed)
OUTPATIENT PHYSICAL THERAPY NOTE/PROGRESS NOTE   Patient Name: Kim Gibson MRN: [id number] DOB:10-26-1950, 72 y.o., female Today's Date: 08/23/2023   Progress Note Reporting Period 07/07/2023 to 08/23/2023   See note below for Objective Data and Assessment of Progress/Goals.       END OF SESSION:  PT End of Session - 08/23/23 1129     Visit Number 10    Number of Visits 17    Date for PT Re-Evaluation 09/01/23    Authorization Type U[employer] MCR    PT Start Time 1130    PT Stop Time 1210    PT Time Calculation (min) 40 min    Activity Tolerance Patient tolerated treatment well    Behavior During Therapy [facility] for tasks assessed/performed                Past Medical History:  Diagnosis Date   Anginal pain (HCC) 02/10/2017   AT REST   Arthritis    Coronary artery disease    Diabetes mellitus without complication (HCC)    Hyperlipemia    Hypertension    Insomnia    Past Surgical History:  Procedure Laterality Date   CARDIAC CATHETERIZATION N/A 09/24/2016   Procedure: Left Heart Cath and Coronary Angiography;  Surgeon: Orpah Cobb, MD;  Location: [facility];  Service: Cardiovascular;  Laterality: N/A;   CARDIOVASCULAR STRESS TEST  11/29/2019   CESAREAN SECTION     CORONARY ANGIOGRAM  2005   LEFT HEART CATH AND CORONARY ANGIOGRAPHY N/A 04/06/2023   Procedure: LEFT HEART CATH AND CORONARY ANGIOGRAPHY;  Surgeon: Orpah Cobb, MD;  Location: [facility];  Service: Cardiovascular;  Laterality: N/A;   Patient Active Problem List   Diagnosis Date Noted   Acute coronary syndrome (HCC) 11/28/2019   Chest pain 02/10/2017   Precordial chest pain 09/24/2016   Angina at rest [facility]) 07/30/2015   Dysphagia 07/30/2015   Heart palpitations 07/30/2015   Diabetes type 2, uncontrolled 07/30/2015   HTN (hypertension) 07/30/2015   HLD (hyperlipidemia) 07/30/2015   Diabetes mellitus with complication (HCC)     PCP: Mitchell Heir,  DO  REFERRING PROVIDER: Margart Sickles, PA-C  REFERRING DIAG: Thoraco-Lumbar Pain   Rationale for Evaluation and Treatment: Rehabilitation  THERAPY DIAG:  Pain in thoracic spine  Other low back pain  ONSET DATE: April 2024.   SUBJECTIVE:                                                                                                                                                                                           SUBJECTIVE STATEMENT:  08/23/2023 Patient reports to PT  with increased pain today. She has not been compliant with HEP. She does endorse overall improvement in symptoms. She continues to have pain/difficulty with getting in/out of the car, in/out of the bed, doing laundry for her grandson.    Eval: Patient reporting to PT with "terrible back pain" in the middle of her back. She states that it's been worse for the past 6 months. She has difficulty walking upright when she first gets up, difficulty laying on her back, standing at the sink. Her pain sometimes radiates from the middle of her back to both sides. 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    PERTINENT HISTORY:  Relevant PMH includes: CAD, DM type 2, HTN, Arthritis, anginal pain, acute coronary syndrome  PAIN:  Are you having pain? Yes: NPRS scale: 4-9/10 Pain location: mid to lower back  Pain description: sharp  Aggravating factors: laying on her back, prolonged standing, bending down Relieving factors: brief relief from salonpas and heat pack   PRECAUTIONS: None  RED FLAGS: None   WEIGHT BEARING RESTRICTIONS: No  FALLS:  Has patient fallen in last 6 months? No  LIVING ENVIRONMENT: Lives with: lives with their family Lives in: House/apartment Stairs: Yes: Internal: several steps; can reach both Has following equipment at home: None  OCCUPATION: n/a   PLOF: Independent  PATIENT GOALS: To have less pain with daily activities    NEXT MD VISIT: unsure at this time  OBJECTIVE:  Note: Objective measures were completed at Evaluation unless otherwise noted.  DIAGNOSTIC FINDINGS:  No relevant imaging results available in epic; none included in referral    PATIENT SURVEYS:  FOTO 36 current, 49 predicted  COGNITION: Overall cognitive status: Within functional limits for tasks assessed     SENSATION: Not tested  POSTURE: moderate rounded shoulders, forward head, and increased thoracic kyphosis  PALPATION: Moderate-to-severe tenderness with palpation along bilateral lower thoracic and upper lumbar paraspinals, bilateral periscapular mm (rhomboids, middle trap)   THORACOLUMBAR ROM:   AROM eval  Thoracic Flexion 25%  Thoracic Extension 75%  Right Rotation 50%  Left Rotation 50%  Right Lateral Flexion   Left Lateral Flexion    Lumbar Flexion    Lumbar Extension    (Blank rows = not tested)  % of full AROM  UPPER EXTREMITY MMT: to be formally assessed at f/u visit    MMT Right eval Left eval  Shoulder flexion    Shoulder extension    Shoulder abduction    Shoulder adduction    Shoulder extension    Shoulder internal rotation    Shoulder external rotation    Middle trapezius    Lower trapezius    Elbow flexion    Elbow extension    Wrist flexion    Wrist extension    Wrist ulnar deviation    Wrist radial deviation    Wrist pronation    Wrist supination    Grip strength     (Blank rows = not tested)    TODAY'S TREATMENT:       [facility] Adult PT Treatment:                                                DATE: 08/23/2023  Therapeutic Exercise: NuStep level 5 x 5 minutes  Standing pball roll 3-way  x 15 each direction  Standing pallof press with green TB, x15 Standing Chops with green TB, 2 x 10  Standing pull downs with green TB, 2 x 10  Low row - 2x10 - 20# High row - 2x10 - 20#                 Manual Therapy CPA and UPA lower thoracic spine, pt in prone STM to bilateral lower  thoracic and upper lumbar paraspinals, pt in prone   Therapeutic Activity:  Reassessment of objective measures and subjective assessment regarding progress towards established goals and updated POC    [facility] Adult PT Treatment:                                                DATE: 08/18/2023  Therapeutic Exercise: NuStep level 5 x 5 minutes  Chest press with SA punch in supine - 20x - 4# weights Supine shoulder circles CW/CCW x 15 each  Supine fly with 4# weights, 2 x 10  Standing pball roll x 20  Standing Chops with green TB, 2 x 10  Low row - 2x10 - 20# High row - 2x10 - 20#                 Manual Therapy CPA lower thoracic spine, pt in prone  [facility] Adult PT Treatment:                                                DATE: 08/16/2023  Therapeutic Exercise:  Towel roll under tx spine for supine exercises  NuStep level 6 x 5 minutes  Thoracic ext on 1/2 foam roller in sitting - x20 W/ rotation x10 Chest press with SA punch in supine - 20x - 4# weights Supine shoulder rolls  Supine fly with 4# weights - 20x Prone T, Prone W - 2x10 ea Standing pball roll x 20  Low row - 2x10 - 15# High row - 2x10 - 15#                 Manual Therapy  CPA and UPA lower thoracic spine, pt in prone                                                                      PATIENT EDUCATION:  Education details: reviewed initial home exercise program; discussion of POC, prognosis and goals for skilled PT; additional time for discussion and patient education related to 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    Person educated: Patient Education method: Explanation, Demonstration, and Handouts Education comprehension: verbalized understanding, returned demonstration, and needs further education  HOME EXERCISE PROGRAM: Access Code: [facility] URL: [email or web address] Date: 08/09/2023 Prepared by: Mauri Reading  Exercises - Standing Cervical Retraction  - 1 x  daily - 7 x weekly - 2 sets - 10 reps - 3 sec hold - Seated Scapular Retraction  - 1 x daily - 7 x weekly - 2 sets - 10 reps - 3  sec hold - Sidelying Open Book  - 1 x daily - 7 x weekly - 1 sets - 10 reps -  sec hold - Standing Shoulder Row with Anchored Resistance  - 1 x daily - 7 x weekly - 2 sets - 10 reps - 3 sec hold - Standing Anti-Rotation Press with Anchored Resistance  - 1 x daily - 7 x weekly - 2 sets - 10 reps - 3 sec hold  ASSESSMENT:  CLINICAL IMPRESSION: Shamir has attended 10 total visits, and has demonstrated some good progress towards established goals. She continues to have lower thoracic pain that is exacerbated by heavy activity and increased stress/depression symptoms. Encouraged patient to return to therapy with a mental health provider to help her through family challenges at this time. She will benefit from ongoing skilled PT to address remaining deficits. Plan is to extend POC at this time for 1x/week for 6 more weeks.      OBJECTIVE IMPAIRMENTS: decreased activity tolerance, decreased endurance, decreased ROM, improper body mechanics, postural dysfunction, and pain.   ACTIVITY LIMITATIONS: carrying, lifting, bending, sitting, standing, sleeping, bed mobility, bathing, dressing, reach over head, and locomotion level  PARTICIPATION LIMITATIONS: meal prep, cleaning, laundry, interpersonal relationship, driving, and community activity  PERSONAL FACTORS: Age, Past/current experiences, Time since onset of injury/illness/exacerbation, and 3+ comorbidities: Relevant PMH includes: CAD, DM type 2, HTN, Arthritis, anginal pain, acute coronary syndrome  are also affecting patient's functional outcome.   REHAB POTENTIAL: Fair    CLINICAL DECISION MAKING: Stable/uncomplicated  EVALUATION COMPLEXITY: Low   GOALS: Goals reviewed with patient? Yes  SHORT TERM GOALS: Target date: 08/04/2023  Patient will be independent with initial home program for initial postural  strengthening and thoracic mobility. Baseline: provided at eval  Goal status:MET as of 08/04/23  2.  Patient will demonstrate improved postural awareness for at least 15 minutes while seated without need for cueing from PT.   Baseline: moderate rounded shoulders, forward head, and increased thoracic kyphosis Goal status: MET as of 08/04/23   LONG TERM GOALS: Target date: 10/04/2023   Patient will report improved overall functional ability with FOTO score of 45 or greater.   Baseline: 36 08/04/23: 43 08/23/23: 54 Goal status: MET  2.  Patient will report no more than 6/10 worst pain severity with ADLs and IADLs.  Baseline: 9/10 worst pain  08/23/23: unable to quantify  Goal status: ONGOING  3.  Patient will demonstrate improved thoracolumbar mobility to at least 65-70% of full AROM.  Baseline:  AROM eval  Thoracic Flexion 25%  Thoracic Extension 75%  Right Rotation 50%  Left Rotation 50%   Goal status: ONGOING  4.  Patient will demonstrate ability to perform floor to waist lifting of at least 20# using appropriate body mechanics and with no more than minimal pain in order to safely perform normal daily/occupational tasks.   Baseline: unable 08/23/23: unable  Goal status: ONGOING  5.  Patient will demonstrate ability to perform overhead lifting of at least 5# using appropriate body mechanics and with no more than minimal pain in order to safely perform normal daily/occupational tasks.   Baseline: unable  08/23/23: able to put a gallon of milk into top shelf of refrigerator if needed.  Goal status: MET   PLAN:  PT FREQUENCY: 1x/week  PT DURATION: 6 weeks  PLANNED INTERVENTIONS: 97146- PT Re-evaluation, 97110-Therapeutic exercises, 97530- Therapeutic activity, [id number]- Neuromuscular re-education, 97535- Self Care, 09811- Manual therapy, 97014- Electrical stimulation (unattended), [id number]- Electrical stimulation (manual), Dry Needling,  Cryotherapy, and Moist heat.  PLAN FOR NEXT  SESSION: assess: lumbar AROM, UE/periscapular strength; interventions: TS/LS mobility, postural endurance, UE aerobic activity, wall slides, manual therapy and modalities as indicated    Mauri Reading, PT, DPT    08/23/2023, 1:01 PM

## 2023-08-30 ENCOUNTER — Encounter: Payer: Self-pay | Admitting: Physical Therapy

## 2023-08-30 ENCOUNTER — Ambulatory Visit: Payer: 59 | Attending: Physician Assistant | Admitting: Physical Therapy

## 2023-08-30 DIAGNOSIS — M5459 Other low back pain: Secondary | ICD-10-CM | POA: Diagnosis present

## 2023-08-30 DIAGNOSIS — M546 Pain in thoracic spine: Secondary | ICD-10-CM | POA: Insufficient documentation

## 2023-08-30 NOTE — Therapy (Signed)
OUTPATIENT PHYSICAL THERAPY NOTE   Patient Name: Kim Gibson MRN: [id number] DOB:11-29-1950, 72 y.o., female Today's Date: 08/30/2023       END OF SESSION:  PT End of Session - 08/30/23 1131     Visit Number 11    Number of Visits 17    Date for PT Re-Evaluation 09/01/23    Authorization Type [employer] MCR    PT Start Time 1130    PT Stop Time 1211    PT Time Calculation (min) 41 min    Activity Tolerance Patient tolerated treatment well    Behavior During Therapy [facility] for tasks assessed/performed                Past Medical History:  Diagnosis Date   Anginal pain (HCC) 02/10/2017   AT REST   Arthritis    Coronary artery disease    Diabetes mellitus without complication (HCC)    Hyperlipemia    Hypertension    Insomnia    Past Surgical History:  Procedure Laterality Date   CARDIAC CATHETERIZATION N/A 09/24/2016   Procedure: Left Heart Cath and Coronary Angiography;  Surgeon: Orpah Cobb, MD;  Location: [facility] IN[facility];  Service: Cardiovascular;  Laterality: N/A;   CARDIOVASCULAR STRESS TEST  11/29/2019   CESAREAN SECTION     CORONARY ANGIOGRAM  2005   LEFT HEART CATH AND CORONARY ANGIOGRAPHY N/A 04/06/2023   Procedure: LEFT HEART CATH AND CORONARY ANGIOGRAPHY;  Surgeon: Orpah Cobb, MD;  Location: [facility] INVAS[facility] CV LAB;  Service: Cardiovascular;  Laterality: N/A;   Patient Active Problem List   Diagnosis Date Noted   Acute coronary syndrome (HCC) 11/28/2019   Chest pain 02/10/2017   Precordial chest pain 09/24/2016   Angina at rest [facility]) 07/30/2015   Dysphagia 07/30/2015   Heart palpitations 07/30/2015   Diabetes type 2, uncontrolled 07/30/2015   HTN (hypertension) 07/30/2015   HLD (hyperlipidemia) 07/30/2015   Diabetes mellitus with complication (HCC)     PCP: Mitchell Heir, DO  REFERRING PROVIDER: Margart Sickles, PA-C  REFERRING DIAG: Thoraco-Lumbar Pain   Rationale for Evaluation and Treatment: Rehabilitation  THERAPY DIAG:   Pain in thoracic spine  Other low back pain  ONSET DATE: April 2024.   SUBJECTIVE:                                                                                                                                                                                           SUBJECTIVE STATEMENT:  08/30/2023 Patient reports to PT with increased pain today. She has not been compliant with HEP. She does endorse overall improvement in symptoms. She continues to have  pain/difficulty with getting in/out of the car, in/out of the bed, doing laundry for her grandson.    Eval: Patient reporting to PT with "terrible back pain" in the middle of her back. She states that it's been worse for the past 6 months. She has difficulty walking upright when she first gets up, difficulty laying on her back, standing at the sink. Her pain sometimes radiates from the middle of her back to both sides. 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    PERTINENT HISTORY:  Relevant PMH includes: CAD, DM type 2, HTN, Arthritis, anginal pain, acute coronary syndrome  PAIN:  Are you having pain? Yes: NPRS scale: 4-9/10 Pain location: mid to lower back  Pain description: sharp  Aggravating factors: laying on her back, prolonged standing, bending down Relieving factors: brief relief from salonpas and heat pack   PRECAUTIONS: None  RED FLAGS: None   WEIGHT BEARING RESTRICTIONS: No  FALLS:  Has patient fallen in last 6 months? No  LIVING ENVIRONMENT: Lives with: lives with their family Lives in: House/apartment Stairs: Yes: Internal: several steps; can reach both Has following equipment at home: None  OCCUPATION: n/a   PLOF: Independent  PATIENT GOALS: To have less pain with daily activities   NEXT MD VISIT: unsure at this time  OBJECTIVE:  Note: Objective measures were completed at Evaluation unless otherwise noted.  DIAGNOSTIC FINDINGS:  No  relevant imaging results available in epic; none included in referral    PATIENT SURVEYS:  FOTO 36 current, 49 predicted  COGNITION: Overall cognitive status: Within functional limits for tasks assessed     SENSATION: Not tested  POSTURE: moderate rounded shoulders, forward head, and increased thoracic kyphosis  PALPATION: Moderate-to-severe tenderness with palpation along bilateral lower thoracic and upper lumbar paraspinals, bilateral periscapular mm (rhomboids, middle trap)   THORACOLUMBAR ROM:   AROM eval  Thoracic Flexion 25%  Thoracic Extension 75%  Right Rotation 50%  Left Rotation 50%  Right Lateral Flexion   Left Lateral Flexion    Lumbar Flexion    Lumbar Extension    (Blank rows = not tested)  % of full AROM  UPPER EXTREMITY MMT: to be formally assessed at f/u visit    MMT Right eval Left eval  Shoulder flexion    Shoulder extension    Shoulder abduction    Shoulder adduction    Shoulder extension    Shoulder internal rotation    Shoulder external rotation    Middle trapezius    Lower trapezius    Elbow flexion    Elbow extension    Wrist flexion    Wrist extension    Wrist ulnar deviation    Wrist radial deviation    Wrist pronation    Wrist supination    Grip strength     (Blank rows = not tested)    TODAY'S TREATMENT:       [facility] Adult PT Treatment:                                                DATE: 08/30/2023  Therapeutic Exercise: NuStep level 5 x 5 minutes  Standing pallof press with green TB, x15 Seated lumbar ext with row - 20# - 2x10 Sit to stand with OH press - 5# ea -  2x10 Standing Chops 10# - 2x10 Diagonal lifts with yellow ball - 12x ea Low row - 2x10 - 20# High row - 2x10 - 20#                 Manual Therapy CPA and UPA lower thoracic spine, pt in prone STM to bilateral lower thoracic and upper lumbar paraspinals, pt in prone    [facility] Adult PT Treatment:                                                DATE:  08/18/2023  Therapeutic Exercise: NuStep level 5 x 5 minutes  Chest press with SA punch in supine - 20x - 4# weights Supine shoulder circles CW/CCW x 15 each  Supine fly with 4# weights, 2 x 10  Standing pball roll x 20  Standing Chops with green TB, 2 x 10  Low row - 2x10 - 20# High row - 2x10 - 20#                 Manual Therapy CPA lower thoracic spine, pt in prone  [facility] Adult PT Treatment:                                                DATE: 08/16/2023  Therapeutic Exercise:  Towel roll under tx spine for supine exercises  NuStep level 6 x 5 minutes  Thoracic ext on 1/2 foam roller in sitting - x20 W/ rotation x10 Chest press with SA punch in supine - 20x - 4# weights Supine shoulder rolls  Supine fly with 4# weights - 20x Prone T, Prone W - 2x10 ea Standing pball roll x 20  Low row - 2x10 - 15# High row - 2x10 - 15#                 Manual Therapy  CPA and UPA lower thoracic spine, pt in prone                                                                      PATIENT EDUCATION:  Education details: reviewed initial home exercise program; discussion of POC, prognosis and goals for skilled PT; additional time for discussion and patient education related to 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    Person educated: Patient Education method: Explanation, Demonstration, and Handouts Education comprehension: verbalized understanding, returned demonstration, and needs further education  HOME EXERCISE PROGRAM: Access Code: [facility] URL: [email or web address] Date: 08/09/2023 Prepared by: Mauri Reading  Exercises - Standing Cervical Retraction  - 1 x daily - 7 x weekly - 2 sets - 10 reps - 3 sec hold - Seated Scapular Retraction  - 1 x daily - 7 x weekly - 2 sets - 10 reps - 3 sec hold - Sidelying Open Book  - 1 x daily - 7 x weekly - 1 sets - 10 reps -  sec hold - Standing Shoulder Row with Anchored Resistance  - 1 x daily -  7  x weekly - 2 sets - 10 reps - 3 sec hold - Standing Anti-Rotation Press with Anchored Resistance  - 1 x daily - 7 x weekly - 2 sets - 10 reps - 3 sec hold  ASSESSMENT:  CLINICAL IMPRESSION: Dasha tolerated session well with no adverse reaction.  Worked on standing exercises concentrating on lumbar and thoracic ext as well as thoracic rotation.  Pt reports significant fatigue and overall reduction in sxs following therapy.   OBJECTIVE IMPAIRMENTS: decreased activity tolerance, decreased endurance, decreased ROM, improper body mechanics, postural dysfunction, and pain.   ACTIVITY LIMITATIONS: carrying, lifting, bending, sitting, standing, sleeping, bed mobility, bathing, dressing, reach over head, and locomotion level  PARTICIPATION LIMITATIONS: meal prep, cleaning, laundry, interpersonal relationship, driving, and community activity  PERSONAL FACTORS: Age, Past/current experiences, Time since onset of injury/illness/exacerbation, and 3+ comorbidities: Relevant PMH includes: CAD, DM type 2, HTN, Arthritis, anginal pain, acute coronary syndrome  are also affecting patient's functional outcome.   REHAB POTENTIAL: Fair    CLINICAL DECISION MAKING: Stable/uncomplicated  EVALUATION COMPLEXITY: Low   GOALS: Goals reviewed with patient? Yes  SHORT TERM GOALS: Target date: 08/04/2023  Patient will be independent with initial home program for initial postural strengthening and thoracic mobility. Baseline: provided at eval  Goal status:MET as of 08/04/23  2.  Patient will demonstrate improved postural awareness for at least 15 minutes while seated without need for cueing from PT.   Baseline: moderate rounded shoulders, forward head, and increased thoracic kyphosis Goal status: MET as of 08/04/23   LONG TERM GOALS: Target date: 10/04/2023   Patient will report improved overall functional ability with FOTO score of 45 or greater.   Baseline: 36 08/04/23: 43 08/23/23: 54 Goal status:  MET  2.  Patient will report no more than 6/10 worst pain severity with ADLs and IADLs.  Baseline: 9/10 worst pain  08/23/23: unable to quantify  Goal status: ONGOING  3.  Patient will demonstrate improved thoracolumbar mobility to at least 65-70% of full AROM.  Baseline:  AROM eval  Thoracic Flexion 25%  Thoracic Extension 75%  Right Rotation 50%  Left Rotation 50%   Goal status: ONGOING  4.  Patient will demonstrate ability to perform floor to waist lifting of at least 20# using appropriate body mechanics and with no more than minimal pain in order to safely perform normal daily/occupational tasks.   Baseline: unable 08/23/23: unable  Goal status: ONGOING  5.  Patient will demonstrate ability to perform overhead lifting of at least 5# using appropriate body mechanics and with no more than minimal pain in order to safely perform normal daily/occupational tasks.   Baseline: unable  08/23/23: able to put a gallon of milk into top shelf of refrigerator if needed.  Goal status: MET   PLAN:  PT FREQUENCY: 1x/week  PT DURATION: 6 weeks  PLANNED INTERVENTIONS: 97146- PT Re-evaluation, 97110-Therapeutic exercises, 97530- Therapeutic activity, 97112- Neuromuscular re-education, 97535- Self Care, 95188- Manual therapy, 97014- Electrical stimulation (unattended), [id number]- Electrical stimulation (manual), Dry Needling, Cryotherapy, and Moist heat.  PLAN FOR NEXT SESSION: assess: lumbar AROM, UE/periscapular strength; interventions: TS/LS mobility, postural endurance, UE aerobic activity, wall slides, manual therapy and modalities as indicated    Kimberlee Nearing Steffie Waggoner PT  08/30/2023, 12:22 PM

## 2023-09-15 ENCOUNTER — Ambulatory Visit: Payer: 59

## 2023-09-15 DIAGNOSIS — M546 Pain in thoracic spine: Secondary | ICD-10-CM | POA: Diagnosis not present

## 2023-09-15 DIAGNOSIS — M5459 Other low back pain: Secondary | ICD-10-CM

## 2023-09-15 NOTE — Therapy (Signed)
OUTPATIENT PHYSICAL THERAPY NOTE   Patient Name: Kim Gibson MRN: [id number] DOB:11-20-1950, 72 y.o., female Today's Date: 09/15/2023       END OF SESSION:  PT End of Session - 09/15/23 1617     Visit Number 12    Number of Visits 17    Date for PT Re-Evaluation 09/01/23    Authorization Type [employer] MCR    PT Start Time 0915    PT Stop Time 0955    PT Time Calculation (min) 40 min    Activity Tolerance Patient tolerated treatment well    Behavior During Therapy [facility] for tasks assessed/performed                 Past Medical History:  Diagnosis Date   Anginal pain (HCC) 02/10/2017   AT REST   Arthritis    Coronary artery disease    Diabetes mellitus without complication (HCC)    Hyperlipemia    Hypertension    Insomnia    Past Surgical History:  Procedure Laterality Date   CARDIAC CATHETERIZATION N/A 09/24/2016   Procedure: Left Heart Cath and Coronary Angiography;  Surgeon: Orpah Cobb, MD;  Location: [facility];  Service: Cardiovascular;  Laterality: N/A;   CARDIOVASCULAR STRESS TEST  11/29/2019   CESAREAN SECTION     CORONARY ANGIOGRAM  2005   LEFT HEART CATH AND CORONARY ANGIOGRAPHY N/A 04/06/2023   Procedure: LEFT HEART CATH AND CORONARY ANGIOGRAPHY;  Surgeon: Orpah Cobb, MD;  Location: [facility];  Service: Cardiovascular;  Laterality: N/A;   Patient Active Problem List   Diagnosis Date Noted   Acute coronary syndrome (HCC) 11/28/2019   Chest pain 02/10/2017   Precordial chest pain 09/24/2016   Angina at rest [facility]) 07/30/2015   Dysphagia 07/30/2015   Heart palpitations 07/30/2015   Diabetes type 2, uncontrolled 07/30/2015   HTN (hypertension) 07/30/2015   HLD (hyperlipidemia) 07/30/2015   Diabetes mellitus with complication (HCC)     PCP: Mitchell Heir, DO  REFERRING PROVIDER: Margart Sickles, PA-C  REFERRING DIAG: Thoraco-Lumbar Pain   Rationale for Evaluation and Treatment: Rehabilitation  THERAPY  DIAG:  Pain in thoracic spine  Other low back pain  ONSET DATE: April 2024.   SUBJECTIVE:                                                                                                                                                                                           SUBJECTIVE STATEMENT:  09/15/2023 Patient reporting ongoing pain with minimal perceived improvement.     PERTINENT HISTORY:  Relevant PMH includes: CAD, DM type 2, HTN, Arthritis, anginal  pain, acute coronary syndrome  PAIN:  Are you having pain? Yes: NPRS scale: 4-9/10 Pain location: mid to lower back  Pain description: sharp  Aggravating factors: laying on her back, prolonged standing, bending down Relieving factors: brief relief from salonpas and heat pack   PRECAUTIONS: None  RED FLAGS: None   WEIGHT BEARING RESTRICTIONS: No  FALLS:  Has patient fallen in last 6 months? No  LIVING ENVIRONMENT: Lives with: lives with their family Lives in: House/apartment Stairs: Yes: Internal: several steps; can reach both Has following equipment at home: None  OCCUPATION: n/a   PLOF: Independent  PATIENT GOALS: To have less pain with daily activities   NEXT MD VISIT: unsure at this time  OBJECTIVE:  Note: Objective measures were completed at Evaluation unless otherwise noted.  DIAGNOSTIC FINDINGS:  No relevant imaging results available in epic; none included in referral    PATIENT SURVEYS:  FOTO 36 current, 49 predicted  COGNITION: Overall cognitive status: Within functional limits for tasks assessed     SENSATION: Not tested  POSTURE: moderate rounded shoulders, forward head, and increased thoracic kyphosis  PALPATION: Moderate-to-severe tenderness with palpation along bilateral lower thoracic and upper lumbar paraspinals, bilateral periscapular mm (rhomboids, middle trap)   THORACOLUMBAR ROM:   AROM eval  Thoracic Flexion 25%  Thoracic Extension 75%  Right Rotation 50%  Left  Rotation 50%  Right Lateral Flexion   Left Lateral Flexion    Lumbar Flexion    Lumbar Extension    (Blank rows = not tested)  % of full AROM  UPPER EXTREMITY MMT: to be formally assessed at f/u visit    MMT Right eval Left eval  Shoulder flexion    Shoulder extension    Shoulder abduction    Shoulder adduction    Shoulder extension    Shoulder internal rotation    Shoulder external rotation    Middle trapezius    Lower trapezius    Elbow flexion    Elbow extension    Wrist flexion    Wrist extension    Wrist ulnar deviation    Wrist radial deviation    Wrist pronation    Wrist supination    Grip strength     (Blank rows = not tested)    TODAY'S TREATMENT:       [facility] Adult PT Treatment:                                                DATE: 09/15/2023  Therapeutic Exercise: NuStep level 5 x 5 minutes  Standing pallof press with green TB, x15 Sit to stand with OH press - 5# ea - 2x10 Standing Chops green TB  - 2x10 Diagonal lifts with yellow ball - 12x ea Low row - x 15 - 25# High row - x 15 - 20#           Updated and reviewed HEP; discussed strategies for improved compliance and independent symptom management          [facility] Adult PT Treatment:                                                DATE: 08/30/2023  Therapeutic Exercise: NuStep level 5 x  5 minutes  Standing pallof press with green TB, x15 Seated lumbar ext with row - 20# - 2x10 Sit to stand with OH press - 5# ea - 2x10 Standing Chops 10# - 2x10 Diagonal lifts with yellow ball - 12x ea Low row - 2x10 - 20# High row - 2x10 - 20#                 Manual Therapy CPA and UPA lower thoracic spine, pt in prone STM to bilateral lower thoracic and upper lumbar paraspinals, pt in prone    [facility] Adult PT Treatment:                                                DATE: 08/18/2023  Therapeutic Exercise: NuStep level 5 x 5 minutes  Chest press with SA punch in supine - 20x - 4# weights Supine shoulder  circles CW/CCW x 15 each  Supine fly with 4# weights, 2 x 10  Standing pball roll x 20  Standing Chops with green TB, 2 x 10  Low row - 2x10 - 20# High row - 2x10 - 20#                 Manual Therapy CPA lower thoracic spine, pt in prone  [facility] Adult PT Treatment:                                                DATE: 08/16/2023  Therapeutic Exercise:  Towel roll under tx spine for supine exercises  NuStep level 6 x 5 minutes  Thoracic ext on 1/2 foam roller in sitting - x20 W/ rotation x10 Chest press with SA punch in supine - 20x - 4# weights Supine shoulder rolls  Supine fly with 4# weights - 20x Prone T, Prone W - 2x10 ea Standing pball roll x 20  Low row - 2x10 - 15# High row - 2x10 - 15#                 Manual Therapy  CPA and UPA lower thoracic spine, pt in prone                                                                      PATIENT EDUCATION:  Education details: reviewed initial home exercise program; discussion of POC, prognosis and goals for skilled PT; additional time for discussion and patient education related to 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    Person educated: Patient Education method: Explanation, Demonstration, and Handouts Education comprehension: verbalized understanding, returned demonstration, and needs further education  HOME EXERCISE PROGRAM: Access Code: [facility] URL: [email or web address] Date: 08/09/2023 Prepared by: Mauri Reading  Exercises - Standing Cervical Retraction  - 1 x daily - 7 x weekly - 2 sets - 10 reps - 3 sec hold - Seated Scapular Retraction  - 1 x daily - 7 x weekly - 2 sets - 10 reps - 3 sec hold - Sidelying Open Book  - 1  x daily - 7 x weekly - 1 sets - 10 reps -  sec hold - Standing Shoulder Row with Anchored Resistance  - 1 x daily - 7 x weekly - 2 sets - 10 reps - 3 sec hold - Standing Anti-Rotation Press with Anchored Resistance  - 1 x daily - 7 x weekly - 2 sets - 10  reps - 3 sec hold  ASSESSMENT:  CLINICAL IMPRESSION: Patient was able to tolerate today's session without adverse effects. Updated and reviewed HEP including discussion of strategies for improving compliance with exercises for symptom management and overall progression towards established goals. Plan to updated therapeutic exercises as tolerated.    OBJECTIVE IMPAIRMENTS: decreased activity tolerance, decreased endurance, decreased ROM, improper body mechanics, postural dysfunction, and pain.   ACTIVITY LIMITATIONS: carrying, lifting, bending, sitting, standing, sleeping, bed mobility, bathing, dressing, reach over head, and locomotion level  PARTICIPATION LIMITATIONS: meal prep, cleaning, laundry, interpersonal relationship, driving, and community activity  PERSONAL FACTORS: Age, Past/current experiences, Time since onset of injury/illness/exacerbation, and 3+ comorbidities: Relevant PMH includes: CAD, DM type 2, HTN, Arthritis, anginal pain, acute coronary syndrome  are also affecting patient's functional outcome.   REHAB POTENTIAL: Fair    CLINICAL DECISION MAKING: Stable/uncomplicated  EVALUATION COMPLEXITY: Low   GOALS: Goals reviewed with patient? Yes  SHORT TERM GOALS: Target date: 08/04/2023  Patient will be independent with initial home program for initial postural strengthening and thoracic mobility. Baseline: provided at eval  Goal status:MET as of 08/04/23  2.  Patient will demonstrate improved postural awareness for at least 15 minutes while seated without need for cueing from PT.   Baseline: moderate rounded shoulders, forward head, and increased thoracic kyphosis Goal status: MET as of 08/04/23   LONG TERM GOALS: Target date: 10/04/2023   Patient will report improved overall functional ability with FOTO score of 45 or greater.   Baseline: 36 08/04/23: 43 08/23/23: 54 Goal status: MET  2.  Patient will report no more than 6/10 worst pain severity with ADLs and  IADLs.  Baseline: 9/10 worst pain  08/23/23: unable to quantify  Goal status: ONGOING  3.  Patient will demonstrate improved thoracolumbar mobility to at least 65-70% of full AROM.  Baseline:  AROM eval  Thoracic Flexion 25%  Thoracic Extension 75%  Right Rotation 50%  Left Rotation 50%   Goal status: ONGOING  4.  Patient will demonstrate ability to perform floor to waist lifting of at least 20# using appropriate body mechanics and with no more than minimal pain in order to safely perform normal daily/occupational tasks.   Baseline: unable 08/23/23: unable  Goal status: ONGOING  5.  Patient will demonstrate ability to perform overhead lifting of at least 5# using appropriate body mechanics and with no more than minimal pain in order to safely perform normal daily/occupational tasks.   Baseline: unable  08/23/23: able to put a gallon of milk into top shelf of refrigerator if needed.  Goal status: MET   PLAN:  PT FREQUENCY: 1x/week  PT DURATION: 6 weeks  PLANNED INTERVENTIONS: 97146- PT Re-evaluation, 97110-Therapeutic exercises, 97530- Therapeutic activity, 97112- Neuromuscular re-education, 97535- Self Care, 16109- Manual therapy, 97014- Electrical stimulation (unattended), [id number]- Electrical stimulation (manual), Dry Needling, Cryotherapy, and Moist heat.  PLAN FOR NEXT SESSION: assess: lumbar AROM, UE/periscapular strength; interventions: TS/LS mobility, postural endurance, UE aerobic activity, wall slides, manual therapy and modalities as indicated    Mauri Reading, PT, DPT    09/15/2023, 4:19 PM

## 2023-09-20 ENCOUNTER — Ambulatory Visit: Payer: 59

## 2023-09-29 ENCOUNTER — Encounter: Payer: 59 | Admitting: Physical Therapy

## 2023-10-06 ENCOUNTER — Ambulatory Visit: Payer: 59 | Attending: Physician Assistant

## 2023-10-06 DIAGNOSIS — M5459 Other low back pain: Secondary | ICD-10-CM

## 2023-10-06 DIAGNOSIS — M546 Pain in thoracic spine: Secondary | ICD-10-CM | POA: Diagnosis present

## 2023-10-06 NOTE — Therapy (Addendum)
 OUTPATIENT PHYSICAL THERAPY NOTE   Patient Name: Kim Gibson MRN: [id number] DOB:1951-02-23, 73 y.o., female Today's Date: 10/06/2023  PHYSICAL THERAPY DISCHARGE SUMMARY  Visits from Start of Care: 13   Current functional level related to goals / functional outcomes: See objective findings/assessment   Remaining deficits: See objective findings/assessment    Education / Equipment: See today's treatment/assessment      Patient agrees to discharge. Patient goals were partially met. Patient is being discharged due to maximized rehab potential.        END OF SESSION:  PT End of Session - 10/06/23 1033     Visit Number 13    Number of Visits 17    Authorization Type [employer] MCR    PT Start Time 1035    PT Stop Time 1115    PT Time Calculation (min) 40 min    Activity Tolerance Patient tolerated treatment well    Behavior During Therapy WFL for tasks assessed/performed                 Past Medical History:  Diagnosis Date   Anginal pain (HCC) 02/10/2017   AT REST   Arthritis    Coronary artery disease    Diabetes mellitus without complication (HCC)    Hyperlipemia    Hypertension    Insomnia    Past Surgical History:  Procedure Laterality Date   CARDIAC CATHETERIZATION N/A 09/24/2016   Procedure: Left Heart Cath and Coronary Angiography;  Surgeon: Salena Negri, MD;  Location: [facility];  Service: Cardiovascular;  Laterality: N/A;   CARDIOVASCULAR STRESS TEST  11/29/2019   CESAREAN SECTION     CORONARY ANGIOGRAM  2005   LEFT HEART CATH AND CORONARY ANGIOGRAPHY N/A 04/06/2023   Procedure: LEFT HEART CATH AND CORONARY ANGIOGRAPHY;  Surgeon: Negri Salena, MD;  Location: [facility];  Service: Cardiovascular;  Laterality: N/A;   Patient Active Problem List   Diagnosis Date Noted   Acute coronary syndrome (HCC) 11/28/2019   Chest pain 02/10/2017   Precordial chest pain 09/24/2016   Angina at rest [facility]) 07/30/2015   Dysphagia  07/30/2015   Heart palpitations 07/30/2015   Diabetes type 2, uncontrolled 07/30/2015   HTN (hypertension) 07/30/2015   HLD (hyperlipidemia) 07/30/2015   Diabetes mellitus with complication (HCC)     PCP: Euell Hug, DO  REFERRING PROVIDER: Ottie Chew, PA-C  REFERRING DIAG: Thoraco-Lumbar Pain   Rationale for Evaluation and Treatment: Rehabilitation  THERAPY DIAG:  Pain in thoracic spine  Other low back pain  ONSET DATE: April 2024.   SUBJECTIVE:  SUBJECTIVE STATEMENT:  Patient reporting to PT with 7/10 pain, but states that she is still experiencing up to 10/10 pain severity with bed mobility, bending and lifting. She is scheduled to f/u with referring provider on 10/20/23, including a review of her most recent MRI.     PERTINENT HISTORY:  Relevant PMH includes: CAD, DM type 2, HTN, Arthritis, anginal pain, acute coronary syndrome  PAIN:  Are you having pain? Yes: NPRS scale: 4-9/10 Pain location: mid to lower back  Pain description: sharp  Aggravating factors: laying on her back, prolonged standing, bending down Relieving factors: brief relief from salonpas  and heat pack   PRECAUTIONS: None  RED FLAGS: None   WEIGHT BEARING RESTRICTIONS: No  FALLS:  Has patient fallen in last 6 months? No  LIVING ENVIRONMENT: Lives with: lives with their family Lives in: House/apartment Stairs: Yes: Internal: several steps; can reach both Has following equipment at home: None  OCCUPATION: n/a   PLOF: Independent  PATIENT GOALS: To have less pain with daily activities   NEXT MD VISIT: unsure at this time  OBJECTIVE:  Note: Objective measures were completed at Evaluation unless otherwise noted.  DIAGNOSTIC FINDINGS:  No relevant imaging results available in epic; none  included in referral    PATIENT SURVEYS:  FOTO 36 current, 49 predicted  COGNITION: Overall cognitive status: Within functional limits for tasks assessed     SENSATION: Not tested  POSTURE: moderate rounded shoulders, forward head, and increased thoracic kyphosis  PALPATION: Moderate-to-severe tenderness with palpation along bilateral lower thoracic and upper lumbar paraspinals, bilateral periscapular mm (rhomboids, middle trap)   THORACOLUMBAR ROM:   AROM eval 10/06/23  Thoracic Flexion 25% 50%  Thoracic Extension 75% 75%  Right Rotation 50% 50%, p!  Left Rotation 50% 50%, p!  Right Lateral Flexion    Left Lateral Flexion     Lumbar Flexion     Lumbar Extension     (Blank rows = not tested)  % of full AROM  UPPER EXTREMITY MMT: to be formally assessed at f/u visit    MMT Right eval Left eval  Shoulder flexion    Shoulder extension    Shoulder abduction    Shoulder adduction    Shoulder extension    Shoulder internal rotation    Shoulder external rotation    Middle trapezius    Lower trapezius    Elbow flexion    Elbow extension    Wrist flexion    Wrist extension    Wrist ulnar deviation    Wrist radial deviation    Wrist pronation    Wrist supination    Grip strength     (Blank rows = not tested)    TODAY'S TREATMENT:      [facility] Adult PT Treatment:                                                DATE: 10/06/2023  Therapeutic Exercise: NuStep level 4 x 8 minutes  Low row - x 15 - 25# High row - x 15 - 20#            Therapeutic Activity:  Reassessment of objective measures and subjective assessment regarding progress towards established goals and plan for independence with prescribed home program following discharged from PT Addressed lifting mechanics x 10 with 10# KB, x 10 with 20# KB; with  associated patient education regarding safe mechanics, and importance of LE strengthening Discussed recommended frequency of strengthening exercises to improve  overall safe and independent function.                                                                      PATIENT EDUCATION:  Education details: reviewed initial home exercise program; discussion of POC, prognosis and goals for skilled PT; additional time for discussion and patient education related to 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    Person educated: Patient Education method: Explanation, Demonstration, and Handouts Education comprehension: verbalized understanding, returned demonstration, and needs further education  HOME EXERCISE PROGRAM: Access Code: [facility] URL: [email or web address] Date: 10/06/2023 Prepared by: Marko Molt  Exercises - Seated Lumbar Flexion Stretch  - 1 x daily - 7 x weekly - 2 sets - 10 reps - 3 sec hold - Standing Cervical Retraction  - 1 x daily - 7 x weekly - 2 sets - 10 reps - 3 sec hold - Standing Shoulder Row with Anchored Resistance  - 1 x daily - 7 x weekly - 2 sets - 10 reps - 3 sec hold - Standing Anti-Rotation Press with Anchored Resistance  - 1 x daily - 7 x weekly - 2 sets - 10 reps - 3 sec hold - Sidelying Open Book  - 1 x daily - 7 x weekly - 1 sets - 10 reps - 3 sec hold - Prone Shoulder Horizontal Abduction with Thumbs Up  - 1 x daily - 7 x weekly - 2 sets - 10 reps - 3 sec hold - Prone W Scapular Retraction  - 1 x daily - 7 x weekly - 2 sets - 10 reps - 3 sec hold - Squat with Chair Touch  - 1 x daily - 7 x weekly - 2 sets - 10 reps - Lunge with Counter Support  - 1 x daily - 7 x weekly - 2 sets - 10 reps - Side Stepping with Resistance at Ankles  - 1 x daily - 7 x weekly  Patient Education - Lifting Techniques - Managing Back Pain  ASSESSMENT:  CLINICAL IMPRESSION: Patient continues to have high pain severity rating using NPRS and has remaining TS/LS mobility deficits. However, she has been able to reach established functional goals. We spent some time today addressing lifting mechanics,  and she was able to perform lifting up to 20# without exacerbation of thoracolumbar pain. D/t plateau in progress with mobility and pain, she will be discharged from skilled PT at this time. She is encouraged to continue with independent HEP, as well as LE strengthening program to maximize overall safe and independent function.     OBJECTIVE IMPAIRMENTS: decreased activity tolerance, decreased endurance, decreased ROM, improper body mechanics, postural dysfunction, and pain.   ACTIVITY LIMITATIONS: carrying, lifting, bending, sitting, standing, sleeping, bed mobility, bathing, dressing, reach over head, and locomotion level  PARTICIPATION LIMITATIONS: meal prep, cleaning, laundry, interpersonal relationship, driving, and community activity  PERSONAL FACTORS: Age, Past/current experiences, Time since onset of injury/illness/exacerbation, and 3+ comorbidities: Relevant PMH includes: CAD, DM type 2, HTN, Arthritis, anginal pain, acute coronary syndrome  are also affecting patient's functional outcome.   REHAB POTENTIAL: Fair    CLINICAL DECISION MAKING: Stable/uncomplicated  EVALUATION COMPLEXITY: Low   GOALS: Goals reviewed with patient? Yes  SHORT TERM GOALS: Target date: 08/04/2023  Patient will be independent with initial home program for initial postural strengthening and thoracic mobility. Baseline: provided at eval  Goal status:MET as of 08/04/23  2.  Patient will demonstrate improved postural awareness for at least 15 minutes while seated without need for cueing from PT.   Baseline: moderate rounded shoulders, forward head, and increased thoracic kyphosis Goal status: MET as of 08/04/23   LONG TERM GOALS: Target date: 10/04/2023   Patient will report improved overall functional ability with FOTO score of 45 or greater.   Baseline: 36 08/04/23: 43 08/23/23: 54 Goal status: MET  2.  Patient will report no more than 6/10 worst pain severity with ADLs and IADLs.  Baseline: 9/10  worst pain  08/23/23: unable to quantify  10/06/23: 7/10 pain today, up to 10/10 pain with lifting; getting into/out of bed  Goal status: NOT MET  3.  Patient will demonstrate improved thoracolumbar mobility to at least 65-70% of full AROM.  Baseline:  AROM eval 10/06/23  Thoracic Flexion 25% 50%  Thoracic Extension 75% 75%  Right Rotation 50% 50%, p!  Left Rotation 50% 50%, p!   Goal status: NOT MET  4.  Patient will demonstrate ability to perform floor to waist lifting of at least 20# using appropriate body mechanics and with no more than minimal pain in order to safely perform normal daily/occupational tasks.   Baseline: unable 08/23/23: unable  10/06/23: able to perform 20# floor to waist lifting x 10 without worsening of thoracolumbar pain  Goal status: MET  5.  Patient will demonstrate ability to perform overhead lifting of at least 5# using appropriate body mechanics and with no more than minimal pain in order to safely perform normal daily/occupational tasks.   Baseline: unable  08/23/23: able to put a gallon of milk into top shelf of refrigerator if needed.  Goal status: MET   PLAN:  PT FREQUENCY: 1x/week  PT DURATION: 6 weeks  PLANNED INTERVENTIONS: 97146- PT Re-evaluation, 97110-Therapeutic exercises, 97530- Therapeutic activity, 97112- Neuromuscular re-education, 97535- Self Care, 02859- Manual therapy, 97014- Electrical stimulation (unattended), [id number]- Electrical stimulation (manual), Dry Needling, Cryotherapy, and Moist heat.  PLAN FOR NEXT SESSION: discharged today with updated HEP, including written patient education regarding management of low back pain and safe lifting mechanics    Marko Molt, PT, DPT  10/06/2023 10:35 AM

## 2023-10-13 ENCOUNTER — Encounter: Payer: 59 | Admitting: Physical Therapy

## 2023-10-16 ENCOUNTER — Other Ambulatory Visit: Payer: Self-pay

## 2023-10-16 ENCOUNTER — Encounter (HOSPITAL_COMMUNITY): Payer: Self-pay | Admitting: Emergency Medicine

## 2023-10-16 ENCOUNTER — Emergency Department (HOSPITAL_COMMUNITY)
Admission: EM | Admit: 2023-10-16 | Discharge: 2023-10-16 | Disposition: A | Discharge status: Home / Self Care | Payer: 59 | Attending: Emergency Medicine | Admitting: Emergency Medicine

## 2023-10-16 DIAGNOSIS — E162 Hypoglycemia, unspecified: Secondary | ICD-10-CM | POA: Diagnosis present

## 2023-10-16 DIAGNOSIS — N390 Urinary tract infection, site not specified: Secondary | ICD-10-CM | POA: Insufficient documentation

## 2023-10-16 DIAGNOSIS — I10 Essential (primary) hypertension: Secondary | ICD-10-CM | POA: Insufficient documentation

## 2023-10-16 DIAGNOSIS — Z7984 Long term (current) use of oral hypoglycemic drugs: Secondary | ICD-10-CM | POA: Insufficient documentation

## 2023-10-16 DIAGNOSIS — E11649 Type 2 diabetes mellitus with hypoglycemia without coma: Secondary | ICD-10-CM | POA: Insufficient documentation

## 2023-10-16 DIAGNOSIS — N3000 Acute cystitis without hematuria: Secondary | ICD-10-CM

## 2023-10-16 DIAGNOSIS — I2510 Atherosclerotic heart disease of native coronary artery without angina pectoris: Secondary | ICD-10-CM | POA: Insufficient documentation

## 2023-10-16 DIAGNOSIS — Z7982 Long term (current) use of aspirin: Secondary | ICD-10-CM | POA: Diagnosis not present

## 2023-10-16 DIAGNOSIS — Z794 Long term (current) use of insulin: Secondary | ICD-10-CM | POA: Diagnosis not present

## 2023-10-16 LAB — CBC WITH DIFFERENTIAL/PLATELET
Abs Immature Granulocytes: 0.01 10*3/uL (ref 0.00–0.07)
Basophils Absolute: 0.1 10*3/uL (ref 0.0–0.1)
Basophils Relative: 1 %
Eosinophils Absolute: 0.1 10*3/uL (ref 0.0–0.5)
Eosinophils Relative: 2 %
HCT: 42.1 % (ref 36.0–46.0)
Hemoglobin: 13.8 g/dL (ref 12.0–15.0)
Immature Granulocytes: 0 %
Lymphocytes Relative: 40 %
Lymphs Abs: 2.5 10*3/uL (ref 0.7–4.0)
MCH: 29.9 pg (ref 26.0–34.0)
MCHC: 32.8 g/dL (ref 30.0–36.0)
MCV: 91.1 fL (ref 80.0–100.0)
Monocytes Absolute: 0.6 10*3/uL (ref 0.1–1.0)
Monocytes Relative: 9 %
Neutro Abs: 2.9 10*3/uL (ref 1.7–7.7)
Neutrophils Relative %: 48 %
Platelets: 206 10*3/uL (ref 150–400)
RBC: 4.62 MIL/uL (ref 3.87–5.11)
RDW: 14.6 % (ref 11.5–15.5)
WBC: 6.1 10*3/uL (ref 4.0–10.5)
nRBC: 0 % (ref 0.0–0.2)

## 2023-10-16 LAB — URINALYSIS, ROUTINE W REFLEX MICROSCOPIC
Bilirubin Urine: NEGATIVE
Glucose, UA: >=500 mg/dL — AB
Hgb urine dipstick: NEGATIVE
Ketones, ur: NEGATIVE mg/dL
Nitrite: NEGATIVE
Protein, ur: NEGATIVE mg/dL
Specific Gravity, Urine: 1.027 (ref 1.005–1.030)
pH: 5 (ref 5.0–8.0)

## 2023-10-16 LAB — CBG MONITORING, ED: Glucose-Capillary: 166 mg/dL — ABNORMAL HIGH (ref 70–99)

## 2023-10-16 LAB — BASIC METABOLIC PANEL
Anion gap: 13 (ref 5–15)
BUN: 25 mg/dL — ABNORMAL HIGH (ref 8–23)
CO2: 23 mmol/L (ref 22–32)
Calcium: 9.5 mg/dL (ref 8.9–10.3)
Chloride: 100 mmol/L (ref 98–111)
Creatinine, Ser: 0.95 mg/dL (ref 0.44–1.00)
GFR, Estimated: >60 mL/min (ref >60)
Glucose, Bld: 154 mg/dL — ABNORMAL HIGH (ref 70–99)
Potassium: 4 mmol/L (ref 3.5–5.1)
Sodium: 136 mmol/L (ref 135–145)

## 2023-10-16 MED ORDER — SODIUM CHLORIDE 0.9 % IV SOLN
1.0000 g | Freq: Once | INTRAVENOUS | Status: AC
  Administered 2023-10-16: 1 g via INTRAVENOUS
  Filled 2023-10-16: qty 10

## 2023-10-16 MED ORDER — CEPHALEXIN 500 MG PO CAPS: 500.0000 mg | ORAL_CAPSULE | Freq: Four times a day (QID) | ORAL | 0 refills | Status: AC

## 2023-10-16 NOTE — ED Triage Notes (Signed)
Patient BIBA for BGL-40. She did drink orange juice at home and it came up to 130. States this has been going on for a few days and was worried. Has appointment with PCP but could not wait. VSS.

## 2023-10-16 NOTE — ED Provider Notes (Signed)
[place] EMERGENCY DEPARTMENT AT [facility] Provider Note   CSN: [id number] Arrival date & time: 10/16/23  7253     History  Chief Complaint  Patient presents with   Hypoglycemia    Kim Gibson Gerie Scheuer is a 73 y.o. female.  The history is provided by the patient and the EMS personnel.  Hypoglycemia Initial blood sugar:  40 Blood sugar after intervention:  130 Severity:  Moderate Onset quality:  Gradual Timing:  Intermittent Progression:  Waxing and waning Chronicity:  Recurrent Diabetic status:  Controlled with insulin Current diabetic therapy:  Insulin and Jardiance Time since last antidiabetic medication: hours. Context: not decreased oral intake, not new diabetes diagnosis and not treatment noncompliance   Relieved by:  Nothing Ineffective treatments:  None tried Associated symptoms: no decreased responsiveness, no speech difficulty, no vomiting and no weakness   Risk factors: no recent surgery   Patient with DM     Past Medical History:  Diagnosis Date   Anginal pain (HCC) 02/10/2017   AT REST   Arthritis    Coronary artery disease    Diabetes mellitus without complication (HCC)    Hyperlipemia    Hypertension    Insomnia      Home Medications Prior to Admission medications   Medication Sig Start Date End Date Taking? Authorizing Provider  albuterol (PROAIR HFA) 108 (90 Base) MCG/ACT inhaler Inhale 2 puffs into the lungs 4 (four) times daily as needed for wheezing or shortness of breath.  06/21/18   [provider]  ascorbic acid (VITAMIN C) 1000 MG tablet Take 1,000 mg by mouth 2 (two) times daily.    [provider]  aspirin EC 81 MG tablet Take 81 mg by mouth at bedtime.     [provider]  atenolol (TENORMIN) 50 MG tablet Take 25 mg by mouth 2 (two) times daily.    [provider]  Coenzyme Q10 (CO Q-10) 120 MG CAPS Take 120 mg by mouth daily.    [provider]  Doxepin HCl 6 MG TABS  Take 6 mg by mouth at bedtime. Patient not taking: Reported on 07/07/2023 05/14/22   [provider]  empagliflozin (JARDIANCE) 25 MG TABS tablet Take 25 mg by mouth daily.    [provider]  enalapril (VASOTEC) 10 MG tablet Take 10 mg by mouth 2 (two) times daily.    [provider]  esomeprazole (NEXIUM) 20 MG capsule Take 20 mg by mouth 2 (two) times daily. 04/20/22   [provider]  ezetimibe (ZETIA) 10 MG tablet Take 10 mg by mouth daily. 02/19/22   [provider]  gabapentin (NEURONTIN) 300 MG capsule Take 300 mg by mouth daily as needed (for nerve pain).    [provider]  LEVEMIR FLEXPEN 100 UNIT/ML FlexPen Inject 30 Units into the skin daily. 06/08/22   [provider]  meloxicam (MOBIC) 7.5 MG tablet Take 7.5 mg by mouth daily.    [provider]  metFORMIN (GLUCOPHAGE) 1000 MG tablet Take 1 tablet (1,000 mg total) by mouth 2 (two) times daily. Starting on Saturday 04/07/23   Orpah Cobb, MD  NIFEdipine (ADALAT CC) 30 MG 24 hr tablet Take 30 mg by mouth 2 (two) times daily. 06/08/22   [provider]  Omega 3-6-9 Fatty Acids (OMEGA-3-6-9 PO) Take 1 capsule by mouth in the morning and at bedtime.     [provider]  Propylene Glycol (SYSTANE BALANCE OP) Place 1 drop into both  eyes in the morning and at bedtime. Patient not taking: Reported on 07/07/2023    [provider]  ranolazine (RANEXA) 500 MG 12 hr tablet Take 1 tablet (500 mg total) by mouth 2 (two) times daily. 02/11/17   Orpah Cobb, MD  rosuvastatin (CRESTOR) 40 MG tablet Take 40 mg by mouth daily. 06/09/22   [provider]  vitamin E 400 UNIT capsule Take 400 Units by mouth daily.    [provider]  zolpidem (AMBIEN) 5 MG tablet Take 5 mg by mouth at bedtime as needed for sleep. 05/24/22   [provider]      Allergies    Tape and Nitroglycerin    Review of Systems   Review of Systems   Constitutional:  Negative for decreased responsiveness.  Gastrointestinal:  Negative for vomiting.  Neurological:  Negative for speech difficulty and weakness.    Physical Exam Updated Vital Signs BP (!) 106/55 (BP Location: Right Arm)   Pulse 86   Temp 98.6 F (37 C) (Oral)   Resp 18   Ht 5\' 2"  (1.575 m)   Wt 78.9 kg   LMP 05/29/2011   SpO2 100%   BMI 31.83 kg/m  Physical Exam  ED Results / Procedures / Treatments   Labs (all labs ordered are listed, but only abnormal results are displayed) Results for orders placed or performed during the hospital encounter of 10/16/23  CBC with Differential   Collection Time: 10/16/23  2:25 AM  Result Value Ref Range   WBC 6.1 4.0 - 10.5 K/uL   RBC 4.62 3.87 - 5.11 MIL/uL   Hemoglobin 13.8 12.0 - 15.0 g/dL   HCT 30.8 65.7 - 84.6 %   MCV 91.1 80.0 - 100.0 fL   MCH 29.9 26.0 - 34.0 pg   MCHC 32.8 30.0 - 36.0 g/dL   RDW 96.2 95.2 - 84.1 %   Platelets 206 150 - 400 K/uL   nRBC 0.0 0.0 - 0.2 %   Neutrophils Relative % 48 %   Neutro Abs 2.9 1.7 - 7.7 K/uL   Lymphocytes Relative 40 %   Lymphs Abs 2.5 0.7 - 4.0 K/uL   Monocytes Relative 9 %   Monocytes Absolute 0.6 0.1 - 1.0 K/uL   Eosinophils Relative 2 %   Eosinophils Absolute 0.1 0.0 - 0.5 K/uL   Basophils Relative 1 %   Basophils Absolute 0.1 0.0 - 0.1 K/uL   Immature Granulocytes 0 %   Abs Immature Granulocytes 0.01 0.00 - 0.07 K/uL  Basic metabolic panel   Collection Time: 10/16/23  2:25 AM  Result Value Ref Range   Sodium 136 135 - 145 mmol/L   Potassium 4.0 3.5 - 5.1 mmol/L   Chloride 100 98 - 111 mmol/L   CO2 23 22 - 32 mmol/L   Glucose, Bld 154 (H) 70 - 99 mg/dL   BUN 25 (H) 8 - 23 mg/dL   Creatinine, Ser 3.24 0.44 - 1.00 mg/dL   Calcium 9.5 8.9 - 40.1 mg/dL   GFR, Estimated >02 >72 mL/min   Anion gap 13 5 - 15  POC CBG, ED   Collection Time: 10/16/23  2:25 AM  Result Value Ref Range   Glucose-Capillary 166 (H) 70 - 99 mg/dL  Urinalysis, Routine w reflex  microscopic -Urine, Clean Catch   Collection Time: 10/16/23  2:43 AM  Result Value Ref Range   Color, Urine YELLOW YELLOW   APPearance CLEAR CLEAR   Specific Gravity, Urine 1.027 1.005 - 1.030  pH 5.0 5.0 - 8.0   Glucose, UA >=500 (A) NEGATIVE mg/dL   Hgb urine dipstick NEGATIVE NEGATIVE   Bilirubin Urine NEGATIVE NEGATIVE   Ketones, ur NEGATIVE NEGATIVE mg/dL   Protein, ur NEGATIVE NEGATIVE mg/dL   Nitrite NEGATIVE NEGATIVE   Leukocytes,Ua MODERATE (A) NEGATIVE   RBC / HPF 0-5 0 - 5 RBC/hpf   WBC, UA 6-10 0 - 5 WBC/hpf   Bacteria, UA RARE (A) NONE SEEN   Squamous Epithelial / HPF 0-5 0 - 5 /HPF   No results found.   Radiology No results found.  Procedures Procedures    Medications Ordered in ED Medications - No data to display  ED Course/ Medical Decision Making/ A&P                                 Medical Decision Making Patient with labile glucose, has an appointment for same with endocrine on Friday   Amount and/or Complexity of Data Reviewed Independent Historian: EMS    Details: See above  Labs: ordered.    Details: Urine is positive for UTI, normal white count 6.1, Normal hemoglobin 13.8, normal platelets. Normal sodium 136, normal potassium 4, normal creatinine 0.95 glucose 154   Risk Risk Details: Glucose has improved in the ED and patient is maintaining glucose.  I will treat UTI with keflex and have patient eat long acting carbohydrates to mitigate fluctuations in glucose.  Follow up with your endocrinologist.      Final Clinical Impression(s) / ED Diagnoses Final diagnoses:  None   Return for intractable cough, coughing up blood, fevers > 100.4 unrelieved by medication, shortness of breath, intractable vomiting, chest pain, shortness of breath, weakness, numbness, changes in speech, facial asymmetry, abdominal pain, passing out, Inability to tolerate liquids or food, cough, altered mental status or any concerns. No signs of systemic illness or  infection. The patient is nontoxic-appearing on exam and vital signs are within normal limits.  I have reviewed the triage vital signs and the nursing notes. Pertinent labs & imaging results that were available during my care of the patient were reviewed by me and considered in my medical decision making (see chart for details). After history, exam, and medical workup I feel the patient has been appropriately medically screened and is safe for discharge home. Pertinent diagnoses were discussed with the patient. Patient was given return precautions.  Rx / DC Orders ED Discharge Orders     None         Doneisha Ivey, MD 10/16/23 [phone]

## 2023-10-20 ENCOUNTER — Other Ambulatory Visit: Payer: Self-pay | Admitting: Physical Medicine and Rehabilitation

## 2023-10-20 DIAGNOSIS — M545 Low back pain, unspecified: Secondary | ICD-10-CM

## 2023-10-21 ENCOUNTER — Other Ambulatory Visit: Payer: Self-pay | Admitting: Physical Medicine and Rehabilitation

## 2023-10-21 DIAGNOSIS — M546 Pain in thoracic spine: Secondary | ICD-10-CM

## 2023-10-21 DIAGNOSIS — M5414 Radiculopathy, thoracic region: Secondary | ICD-10-CM

## 2023-10-21 DIAGNOSIS — M5134 Other intervertebral disc degeneration, thoracic region: Secondary | ICD-10-CM

## 2023-10-21 DIAGNOSIS — M545 Low back pain, unspecified: Secondary | ICD-10-CM

## 2023-10-24 NOTE — Discharge Instructions (Signed)
Post Procedure Spinal Discharge Instruction Sheet  You may resume a regular diet and any medications that you routinely take (including pain medications) unless otherwise noted by MD.  No driving day of procedure.  Light activity throughout the rest of the day.  Do not do any strenuous work, exercise, bending or lifting.  The day following the procedure, you can resume normal physical activity but you should refrain from exercising or physical therapy for at least three days thereafter.  You may apply ice to the injection site, 20 minutes on, 20 minutes off, as needed. Do not apply ice directly to skin.    Common Side Effects:  Headaches- take your usual medications as directed by your physician.  Increase your fluid intake.  Caffeinated beverages may be helpful.  Lie flat in bed until your headache resolves.  Restlessness or inability to sleep- you may have trouble sleeping for the next few days.  Ask your referring physician if you need any medication for sleep.  Facial flushing or redness- should subside within a few days.  Increased pain- a temporary increase in pain a day or two following your procedure is not unusual.  Take your pain medication as prescribed by your referring physician.  Leg cramps  Please contact our office at [phone] for the following symptoms: Fever greater than 100 degrees. Headaches unresolved with medication after 2-3 days. Increased swelling, pain, or redness at injection site.   Thank you for visiting [facility] today.   YOU MAY RESUME YOUR ASPIRIN ANYTIME AFTER PROCEDURE TODAY

## 2023-10-25 ENCOUNTER — Ambulatory Visit
Admission: RE | Admit: 2023-10-25 | Discharge: 2023-10-25 | Disposition: A | Discharge status: Home / Self Care | Payer: 59 | Source: Ambulatory Visit | Attending: Physical Medicine and Rehabilitation | Admitting: Physical Medicine and Rehabilitation

## 2023-10-25 DIAGNOSIS — M5134 Other intervertebral disc degeneration, thoracic region: Secondary | ICD-10-CM

## 2023-10-25 DIAGNOSIS — M546 Pain in thoracic spine: Secondary | ICD-10-CM

## 2023-10-25 DIAGNOSIS — M5414 Radiculopathy, thoracic region: Secondary | ICD-10-CM

## 2023-10-25 MED ORDER — IOPAMIDOL (ISOVUE-M 300) INJECTION 61%
1.0000 mL | Freq: Once | INTRAMUSCULAR | Status: AC | PRN
  Administered 2023-10-25: 1 mL via EPIDURAL

## 2023-10-25 MED ORDER — TRIAMCINOLONE ACETONIDE 40 MG/ML IJ SUSP (RADIOLOGY)
60.0000 mg | Freq: Once | INTRAMUSCULAR | Status: AC
  Administered 2023-10-25: 60 mg via EPIDURAL

## 2024-03-05 ENCOUNTER — Other Ambulatory Visit: Payer: Self-pay | Admitting: Physical Medicine and Rehabilitation

## 2024-03-05 DIAGNOSIS — M546 Pain in thoracic spine: Secondary | ICD-10-CM

## 2024-03-05 DIAGNOSIS — M5414 Radiculopathy, thoracic region: Secondary | ICD-10-CM

## 2024-03-05 DIAGNOSIS — M5134 Other intervertebral disc degeneration, thoracic region: Secondary | ICD-10-CM

## 2024-03-15 NOTE — Progress Notes (Signed)
 [facility] - Clinic Note  03/20/2024     CHIEF COMPLAINT Patient presents for Retina Follow Up   HISTORY OF PRESENT ILLNESS: Kim Gibson is a 73 y.o. female who presents to the clinic today for:   HPI     Retina Follow Up   In both eyes.  This started 1 year ago.  Duration of 1 year.  Since onset it is stable.  I, the attending physician,  performed the HPI with the patient and updated documentation appropriately.        Comments   1 year retina follow up DM exam pt is reporting that she has been having some itching she went to urgent care and is using erythromycin  ointment she denies any vision changes noticed no flashes or floaters her last reading 400 range has been up and down for the last 4 months A1C 7.8 Pt has been going to [facility] for peripheral focal chorioretinal inflammation of both eyes she is using  Loteprednol PRN and Systane as needed       Last edited by Valdemar Rogue, MD on 03/24/2024 12:22 AM.    Pt states her sugar levels spike to 400, she states she uses lantus  every morning and novolog  3 times a day, she saw her dr last week and he said that her sugar levels haven't really changed, so he changed the dosing of the novolog , she is also on metformin  and jardiance , pt states last Tuesday her eye started itching so she went to urgent care and was given erythromycin  ointment to use 3 times a day   Referring physician: Euell Hug, DO [address] [place],  [place] [address]  HISTORICAL INFORMATION:   Selected notes from the MEDICAL RECORD NUMBER Referred by Glinda Like, FNP (PCP) for diabetic eye exam LEE:  Ocular Hx- history CR inflammation managed by Dr. Maree -- last visit Feb 2024; history of azathioprine use, but no longer using PMH-    CURRENT MEDICATIONS: Current Outpatient Medications (Ophthalmic Drugs)  Medication Sig   loteprednol (LOTEMAX) 0.5 % ophthalmic suspension Apply 1 drop to eye as needed.   Propylene  Glycol (SYSTANE BALANCE OP) Place 1 drop into both eyes in the morning and at bedtime. (Patient not taking: Reported on 07/07/2023)   No current facility-administered medications for this visit. (Ophthalmic Drugs)   Current Outpatient Medications (Other)  Medication Sig   Accu-Chek Softclix Lancets lancets 1 strip by Other route.   albuterol  (PROAIR  HFA) 108 (90 Base) MCG/ACT inhaler Inhale 2 puffs into the lungs 4 (four) times daily as needed for wheezing or shortness of breath.    ALPRAZolam  (XANAX ) 0.25 MG tablet Take 0.25 mg by mouth as directed.   ALPRAZolam  (XANAX ) 0.25 MG tablet Take 0.25 mg by mouth daily.   ascorbic acid  (VITAMIN C ) 1000 MG tablet Take 1,000 mg by mouth 2 (two) times daily.   aspirin  EC 81 MG tablet Take 81 mg by mouth at bedtime.    atenolol  (TENORMIN ) 50 MG tablet Take 25 mg by mouth 2 (two) times daily.   BD PEN NEEDLE SHORT ULTRAFINE 31G X 8 MM MISC See admin instructions.   BELSOMRA 10 MG TABS Take 1 tablet by mouth at bedtime.   BELSOMRA 20 MG TABS Take 1 tablet by mouth at bedtime.   cephALEXin  (KEFLEX ) 500 MG capsule Take 1 capsule (500 mg total) by mouth 4 (four) times daily.   clobetasol ointment (TEMOVATE) 0.05 % Apply 1 Application topically 2 (  two) times daily.   Coenzyme Q10 (CO Q-10) 120 MG CAPS Take 120 mg by mouth daily.   diclofenac (VOLTAREN) 50 MG EC tablet Take 50 mg by mouth 2 (two) times daily as needed.   diclofenac (VOLTAREN) 75 MG EC tablet Take 75 mg by mouth 2 (two) times daily as needed.   Doxepin HCl 6 MG TABS Take 6 mg by mouth at bedtime. (Patient not taking: Reported on 07/07/2023)   empagliflozin  (JARDIANCE ) 25 MG TABS tablet Take 25 mg by mouth daily.   enalapril  (VASOTEC ) 10 MG tablet Take 10 mg by mouth 2 (two) times daily.   esomeprazole (NEXIUM) 20 MG capsule Take 20 mg by mouth 2 (two) times daily.   estradiol (ESTRACE) 0.1 MG/GM vaginal cream Place 1 Applicatorful vaginally at bedtime.   ezetimibe  (ZETIA ) 10 MG tablet Take  10 mg by mouth daily.   fluconazole (DIFLUCAN) 150 MG tablet Take 150 mg by mouth once a week.   gabapentin (NEURONTIN) 300 MG capsule Take 300 mg by mouth daily as needed (for nerve pain).   glucose blood (ACCU-CHEK GUIDE TEST) test strip 1 each by Other route as needed.   Hydroquinone-HC-Tretinoin (YOKATAR) 4-2.5-0.025 % EMUL Apply topically.   Hydroquinone-HC-Tretinoin (YOKATAR) 4-2.5-0.025 % EMUL Apply topically.   Insulin  Glargine (BASAGLAR  KWIKPEN) 100 UNIT/ML Inject 50 Units into the skin daily.   insulin  glargine (LANTUS ) 100 UNIT/ML injection Inject 15 Units into the skin daily.   insulin  lispro (HUMALOG) 100 UNIT/ML KwikPen Inject 4 Units into the skin 3 (three) times daily.   LEVEMIR FLEXPEN 100 UNIT/ML FlexPen Inject 30 Units into the skin daily.   meclizine (ANTIVERT) 25 MG tablet Take 25 mg by mouth 2 (two) times daily.   meloxicam (MOBIC) 7.5 MG tablet Take 7.5 mg by mouth daily.   metFORMIN  (GLUCOPHAGE ) 1000 MG tablet Take 1 tablet (1,000 mg total) by mouth 2 (two) times daily. Starting on Saturday   NIFEdipine  (ADALAT  CC) 30 MG 24 hr tablet Take 30 mg by mouth 2 (two) times daily.   NOVOLOG  FLEXPEN 100 UNIT/ML FlexPen Inject 8 Units into the skin 3 (three) times daily with meals.   Omega 3-6-9 Fatty Acids (OMEGA-3-6-9 PO) Take 1 capsule by mouth in the morning and at bedtime.    ranolazine  (RANEXA ) 500 MG 12 hr tablet Take 1 tablet (500 mg total) by mouth 2 (two) times daily.   rosuvastatin  (CRESTOR ) 40 MG tablet Take 40 mg by mouth daily.   tiZANidine (ZANAFLEX) 4 MG tablet Take 2 mg by mouth 3 (three) times daily as needed.   vitamin E 400 UNIT capsule Take 400 Units by mouth daily.   zolpidem  (AMBIEN ) 5 MG tablet Take 5 mg by mouth at bedtime as needed for sleep.   No current facility-administered medications for this visit. (Other)   REVIEW OF SYSTEMS: ROS   Positive for: Endocrine, Eyes, Respiratory Last edited by Resa Delon ORN, COT on 03/20/2024  8:47 AM.       ALLERGIES Allergies  Allergen Reactions   Tape Itching and Other (See Comments)   Nitroglycerin  Anxiety and Other (See Comments)    Made me feel like I was going to faint- it knocked me out  Other Reaction(s): Not available   PAST MEDICAL HISTORY Past Medical History:  Diagnosis Date   Anginal pain (HCC) 02/10/2017   AT REST   Arthritis    Coronary artery disease    Diabetes mellitus without complication (HCC)    Hyperlipemia    Hypertension  Insomnia    Past Surgical History:  Procedure Laterality Date   CARDIAC CATHETERIZATION N/A 09/24/2016   Procedure: Left Heart Cath and Coronary Angiography;  Surgeon: Salena Negri, MD;  Location: [facility] IN[facility]AS[facility] CV [facility];  Service: Cardiovascular;  Laterality: N/A;   CARDIOVASCULAR STRESS TEST  11/29/2019   CESAREAN SECTION     CORONARY ANGIOGRAM  2005   LEFT HEART CATH AND CORONARY ANGIOGRAPHY N/A 04/06/2023   Procedure: LEFT HEART CATH AND CORONARY ANGIOGRAPHY;  Surgeon: Negri Salena, MD;  Location: [facility] INVAS[facility] CV [facility];  Service: Cardiovascular;  Laterality: N/A;   FAMILY HISTORY Family History  Problem Relation Age of Onset   Heart attack Mother 43   Heart attack Father 52   Heart disease Brother    Breast cancer Neg Hx    SOCIAL HISTORY Social History   Tobacco Use   Smoking status: Never   Smokeless tobacco: Never  Vaping Use   Vaping status: Never Used  Substance Use Topics   Alcohol  use: No   Drug use: No       OPHTHALMIC EXAM:  Base Eye Exam     Visual Acuity (Snellen - Linear)       Right Left   Dist cc 20/25 20/25 -1   Dist ph cc NI NI    Correction: Glasses         Tonometry (Tonopen, 8:55 AM)       Right Left   Pressure 21 21  Squeezing         Pupils       Pupils Dark Light Shape React APD   Right PERRL 4 3 Round Brisk None   Left PERRL 4 3 Round Brisk None         Visual Fields       Left Right    Full Full         Extraocular Movement       Right Left    Full,  Ortho Full, Ortho         Neuro/Psych     Oriented x3: Yes   Mood/Affect: Normal         Dilation     Both eyes: 2.5% Phenylephrine @ 8:55 AM           Slit Lamp and Fundus Exam     Slit Lamp Exam       Right Left   Lids/Lashes Dermatochalasis - upper lid, mild Ptosis Dermatochalasis - upper lid, Ptosis, inspissated meibomian gland temporal lower lid   Conjunctiva/Sclera mild melanosis mild melanosis, no injection   Cornea arcus, well healed cataract wound, trace PEE, trace tear film debris arcus, trace PEE, tear film debris, well healed cataract wound   Anterior Chamber deep and clear deep and clear   Iris Round and dilated, No NVI Round and dilated, No NVI   Lens PC IOL in good position PC IOL in good position   Anterior Vitreous trace syneresis mild syneresis         Fundus Exam       Right Left   Disc Pink and Sharp, Compact, mild PPP Pink and Sharp, Compact, mild tilt, +PPP   C/D Ratio 0.2 0.5   Macula Flat, Good foveal reflex, RPE mottling, No heme or edema Flat, Good foveal reflex, RPE mottling, No heme or edema   Vessels mild attenuation, mild tortuosity mild attenuation, mild tortuosity   Periphery Attached, No heme Attached, No heme  Refraction     Wearing Rx       Sphere Cylinder Axis Add   Right -0.75 +0.25 164 +2.50   Left -1.00 +0.50 010 +2.50           IMAGING AND PROCEDURES  Imaging and Procedures for 03/20/2024  OCT, Retina - OU - Both Eyes       Right Eye Quality was good. Central Foveal Thickness: 246. Progression has improved. Findings include normal foveal contour, no SRF, intraretinal hyper-reflective material (Interval improvement in focal IRF/IRHM superior midzone caught on widefield).   Left Eye Quality was good. Central Foveal Thickness: 244. Progression has been stable. Findings include normal foveal contour, no IRF, no SRF.   Notes *Images captured and stored on drive  Diagnosis / Impression:  OD: no  DME / CME centrally -- Interval improvement in focal IRF/IRHM superior midzone caught on widefield OS: No DME / CME  Clinical management:  See below  Abbreviations: NFP - Normal foveal profile. CME - cystoid macular edema. PED - pigment epithelial detachment. IRF - intraretinal fluid. SRF - subretinal fluid. EZ - ellipsoid zone. ERM - epiretinal membrane. ORA - outer retinal atrophy. ORT - outer retinal tubulation. SRHM - subretinal hyper-reflective material. IRHM - intraretinal hyper-reflective material            ASSESSMENT/PLAN:    ICD-10-CM   1. Diabetes mellitus type 2 without retinopathy (HCC)  E11.9 OCT, Retina - OU - Both Eyes    2. Current use of insulin  (HCC)  Z79.4     3. Long term (current) use of oral hypoglycemic drugs  Z79.84     4. Essential hypertension  I10     5. Hypertensive retinopathy of both eyes  H35.033     6. 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 Pseudophakia, both eyes  Z96.1      1-3. Diabetes mellitus, type 2 without retinopathy  - last A1c 7.6 on 06.18.25 - The incidence, risk factors for progression, natural history and treatment options for diabetic retinopathy  were discussed with patient.   - The need for close monitoring of blood glucose, blood pressure, and serum lipids, avoiding cigarette or any type of tobacco, and the need for long term follow up was also discussed with patient. - f/u in 1 year, sooner prn  4,5. Hypertensive retinopathy OU - discussed importance of tight BP control - monitor  6. Hx of chorioretinal inflammation OU  - managed by Dr. Maree  - history of azathioprine use -- now off IMT -- using Lotemax and ATs prn   - quiet today -- no active inflammation on exam  7. Pseudophakia OU  - s/p CE/IOL (Dr. Roz)  - IOL in good position, doing well  - monitor  Ophthalmic Meds Ordered this visit:  No orders of the defined types were placed in this encounter.    Return in about 1 year (around  03/20/2025) for f/u DM exam, DFE, OCT.  There are no Patient Instructions on file for this visit.   Explained the diagnoses, plan, and follow up with the patient and they expressed understanding.  Patient expressed understanding of the importance of proper follow up care.   This document serves as a record of services personally performed by Redell JUDITHANN Hans, MD, PhD. It was created on their behalf by Alan PARAS. Delores, OA an ophthalmic technician. The creation of this record is the provider's dictation and/or activities during the visit.  Electronically signed by: Alan PARAS. Delores, OA 03/24/24 12:23 AM  Redell JUDITHANN Hans, M.D., Ph.D. Diseases & Surgery of the Retina and Vitreous [facility] 06.24.2024  I have reviewed the above documentation for accuracy and completeness, and I agree with the above. Redell JUDITHANN Hans, M.D., Ph.D. 03/24/24 12:28 AM   Abbreviations: M myopia (nearsighted); A astigmatism; H hyperopia (farsighted); P presbyopia; Mrx spectacle prescription;  CTL contact lenses; OD right eye; OS left eye; OU both eyes  XT exotropia; ET esotropia; PEK punctate epithelial keratitis; PEE punctate epithelial erosions; DES dry eye syndrome; MGD meibomian gland dysfunction; ATs artificial tears; PFAT's preservative free artificial tears; NSC nuclear sclerotic cataract; PSC posterior subcapsular cataract; ERM epi-retinal membrane; PVD posterior vitreous detachment; RD retinal detachment; DM diabetes mellitus; DR diabetic retinopathy; NPDR non-proliferative diabetic retinopathy; PDR proliferative diabetic retinopathy; CSME clinically significant macular edema; DME diabetic macular edema; dbh dot blot hemorrhages; CWS cotton wool spot; POAG primary open angle glaucoma; C/D cup-to-disc ratio; HVF humphrey visual field; GVF goldmann visual field; OCT optical coherence tomography; IOP intraocular pressure; BRVO Branch retinal vein occlusion; CRVO central retinal vein occlusion;  CRAO central retinal artery occlusion; BRAO branch retinal artery occlusion; RT retinal tear; SB scleral buckle; PPV pars plana vitrectomy; VH Vitreous hemorrhage; PRP panretinal laser photocoagulation; IVK intravitreal kenalog ; VMT vitreomacular traction; MH Macular hole;  NVD neovascularization of the disc; NVE neovascularization elsewhere; AREDS age related eye disease study; ARMD age related macular degeneration; POAG primary open angle glaucoma; EBMD epithelial/anterior basement membrane dystrophy; ACIOL anterior chamber intraocular lens; IOL intraocular lens; PCIOL posterior chamber intraocular lens; Phaco/IOL phacoemulsification with intraocular lens placement; PRK photorefractive keratectomy; LASIK laser assisted in situ keratomileusis; HTN hypertension; DM diabetes mellitus; COPD chronic obstructive pulmonary disease

## 2024-03-16 ENCOUNTER — Ambulatory Visit: Admission: EM | Admit: 2024-03-16 | Discharge: 2024-03-16 | Disposition: A | Discharge status: Home / Self Care

## 2024-03-16 DIAGNOSIS — S0502XA Injury of conjunctiva and corneal abrasion without foreign body, left eye, initial encounter: Secondary | ICD-10-CM | POA: Diagnosis not present

## 2024-03-16 MED ORDER — FLUORESCEIN SODIUM 1 MG OP STRP
1.0000 | ORAL_STRIP | Freq: Once | OPHTHALMIC | Status: AC
  Administered 2024-03-16: 1 via OPHTHALMIC

## 2024-03-16 MED ORDER — ERYTHROMYCIN 5 MG/GM OP OINT: TOPICAL_OINTMENT | Freq: Four times a day (QID) | OPHTHALMIC | 0 refills | Status: AC

## 2024-03-16 MED ORDER — ERYTHROMYCIN 5 MG/GM OP OINT: TOPICAL_OINTMENT | OPHTHALMIC | 0 refills | Status: DC

## 2024-03-16 MED ORDER — TETRACAINE HCL 0.5 % OP SOLN
2.0000 [drp] | Freq: Once | OPHTHALMIC | Status: AC
  Administered 2024-03-16: 2 [drp] via OPHTHALMIC

## 2024-03-16 NOTE — Discharge Instructions (Addendum)
 You are seen today for concerns of irritation to the left eye.  You are found to have a corneal abrasion with small scratch to the left cornea ongoing.  I am sending home with a prescription for an antibiotic ointment called erythromycin that you will apply 4 times daily to the left eye.  You may also apply topical steroid such as hydrocortisone that you can buy over-the-counter to the irritated skin of the left eye however do not apply directly to the eyelid.  For any concerns of new or worsening symptoms, follow-up with us  at the urgent care or seek care in the emergency department.

## 2024-03-16 NOTE — ED Triage Notes (Signed)
 For 2 days now I am having lots of irritation/itching in my left eye. No injury.

## 2024-03-16 NOTE — ED Provider Notes (Signed)
 [facility]    CSN: [id number] Arrival date & time: 03/16/24  1057      History   Chief Complaint Chief Complaint  Patient presents with   Eye Problem    HPI Kim Gibson Kim Gibson is a 73 y.o. female.  Patient presents to the urgent care today with concerns of left eye discomfort.  Reports has been ongoing for the last 2 days or so.  Denies any recent contact with any sick individuals or any other recent exposures to any irritants as far she can note.  She does report that she has been using turmeric and honey on her eye starting about 2 weeks ago without any irritation at that time.  Has not used this recently.  She reports that she currently sees dermatology for a pigmentation abnormality but denies putting any medications towards the eyelids.  No other acute viral type symptoms including fever, chills, cough, congestion.  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   Macular pucker, left eye 03/31/2021   Pseudophakia 03/31/2021   Herpes simplex vulvovaginitis 03/11/2021   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   Xanthelasma of eyelid, bilateral 03/10/2021   Ingrown toenail 02/26/2021   Carpal tunnel syndrome, bilateral 09/01/2020   Weight loss, unintentional 06/03/2020   Numbness and tingling of both upper extremities 04/28/2020   Neuropathy 04/28/2020   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   AS (atherosclerosis) 12/16/2019   Needle phobia  12/16/2019   Acute coronary syndrome (HCC) 11/28/2019   Heart murmur 06/27/2018   Vitamin D deficiency 06/27/2018   Chest pain 02/10/2017   Precordial chest pain 09/24/2016   Angina at rest [facility]) 07/30/2015   Dysphagia 07/30/2015   Heart palpitations 07/30/2015   Type 2 diabetes mellitus without complication, without long-term current use of insulin  (HCC) 07/30/2015   HTN (hypertension) 07/30/2015   HLD (hyperlipidemia) 07/30/2015   Essential hypertension 07/30/2015    Past Surgical History:  Procedure Laterality Date   CARDIAC CATHETERIZATION N/A 09/24/2016   Procedure: Left Heart Cath and Coronary Angiography;  Surgeon: Pasqual Bone, MD;  Location: [facility];  Service: Cardiovascular;  Laterality: N/A;   CARDIOVASCULAR STRESS TEST  11/29/2019   CESAREAN SECTION     CORONARY ANGIOGRAM  2005   LEFT HEART CATH AND CORONARY ANGIOGRAPHY N/A 04/06/2023   Procedure: LEFT HEART CATH AND CORONARY ANGIOGRAPHY;  Surgeon: Pasqual Bone, MD;  Location: [facility];  Service: Cardiovascular;  Laterality: N/A;    OB History   No obstetric history on file.      Home Medications    Prior to Admission medications   Medication Sig Start Date End Date Taking? Authorizing Provider  Accu-Chek Softclix Lancets lancets 1 strip by Other route. 07/25/23  Yes [provider]  ALPRAZolam  (XANAX ) 0.25 MG tablet Take 0.25 mg by mouth as directed. 12/28/23  Yes [provider]  ALPRAZolam  (XANAX ) 0.25 MG tablet Take 0.25 mg  by mouth daily. 03/07/24  Yes [provider]  BD PEN NEEDLE SHORT ULTRAFINE 31G X 8 MM MISC See admin instructions. 03/14/24  Yes [provider]  BELSOMRA 10 MG TABS Take 1 tablet by mouth at bedtime. 09/20/23  Yes [provider]  BELSOMRA 20 MG TABS Take 1 tablet by mouth at bedtime. 03/08/24  Yes [provider]  clobetasol ointment (TEMOVATE) 0.05 % Apply 1 Application topically 2 (two) times daily. 10/17/23  Yes  [provider]  diclofenac (VOLTAREN) 75 MG EC tablet Take 75 mg by mouth 2 (two) times daily as needed. 03/05/24  Yes [provider]  fluconazole (DIFLUCAN) 150 MG tablet Take 150 mg by mouth once a week. 10/06/23  Yes [provider]  glucose blood (ACCU-CHEK GUIDE TEST) test strip 1 each by Other route as needed. 07/25/23  Yes [provider]  Hydroquinone-HC-Tretinoin (YOKATAR) 4-2.5-0.025 % EMUL Apply topically. 05/11/22  Yes [provider]  Insulin  Glargine (BASAGLAR  KWIKPEN) 100 UNIT/ML Inject 50 Units into the skin daily. 03/14/24  Yes [provider]  insulin  glargine (LANTUS ) 100 UNIT/ML injection Inject 15 Units into the skin daily.   Yes [provider]  meclizine (ANTIVERT) 25 MG tablet Take 25 mg by mouth 2 (two) times daily. 02/22/24  Yes [provider]  NOVOLOG  FLEXPEN 100 UNIT/ML FlexPen Inject 8 Units into the skin 3 (three) times daily with meals. 03/14/24  Yes [provider]  albuterol  (PROAIR  HFA) 108 (90 Base) MCG/ACT inhaler Inhale 2 puffs into the lungs 4 (four) times daily as needed for wheezing or shortness of breath.  06/21/18   [provider]  ascorbic acid  (VITAMIN C ) 1000 MG tablet Take 1,000 mg by mouth 2 (two) times daily.    [provider]  aspirin  EC 81 MG tablet Take 81 mg by mouth at bedtime.     [provider]  atenolol  (TENORMIN ) 50 MG tablet Take 25 mg by mouth 2 (two) times daily.    [provider]  cephALEXin  (KEFLEX ) 500 MG capsule Take 1 capsule (500 mg total) by mouth 4 (four) times daily. 10/16/23   Palumbo, April, MD  Coenzyme Q10 (CO Q-10) 120 MG CAPS Take 120 mg by mouth daily.    [provider]  diclofenac (VOLTAREN) 50 MG EC tablet Take 50 mg by mouth 2 (two) times daily as needed.    [provider]  Doxepin HCl 6 MG TABS Take 6 mg by mouth at bedtime. Patient not taking: Reported on 07/07/2023 05/14/22   [provider]  empagliflozin  (JARDIANCE ) 25 MG TABS tablet Take 25 mg by mouth daily.    [provider]  enalapril  (VASOTEC ) 10 MG tablet Take 10 mg by mouth 2 (two) times daily.    [provider]  erythromycin ophthalmic ointment Place into the left eye 4 (four) times daily for 5 days. 03/16/24 03/21/24  Rhiley Tarver A, PA-C  esomeprazole (NEXIUM) 20 MG capsule Take 20 mg by mouth 2 (two) times daily. 04/20/22   [provider]  estradiol (ESTRACE) 0.1 MG/GM vaginal cream Place 1 Applicatorful vaginally at bedtime.    [provider]  ezetimibe  (ZETIA ) 10 MG tablet Take 10 mg by mouth daily. 02/19/22   [provider]  gabapentin (NEURONTIN) 300 MG capsule Take 300 mg by mouth daily as needed (for nerve pain).    [provider]  Hydroquinone-HC-Tretinoin (YOKATAR) 4-2.5-0.025 % EMUL Apply topically.    [provider]  insulin  lispro (HUMALOG) 100 UNIT/ML KwikPen Inject 4 Units into the skin 3 (three) times daily. 12/13/23   [provider]  LEVEMIR FLEXPEN 100 UNIT/ML FlexPen Inject 30 Units into the skin daily. 06/08/22   [provider]  loteprednol (LOTEMAX) 0.5 % ophthalmic suspension Apply 1 drop to eye as needed.    [provider]  meloxicam (MOBIC) 7.5 MG tablet Take 7.5 mg by mouth daily.    [provider]  metFORMIN  (GLUCOPHAGE ) 1000 MG tablet Take 1 tablet (1,000 mg total) by mouth 2 (two) times daily. Starting on Saturday 04/07/23   Pasqual Bone, MD  NIFEdipine  (ADALAT  CC) 30 MG 24 hr tablet Take 30 mg by mouth 2 (two) times daily. 06/08/22   [provider]  Omega 3-6-9 Fatty Acids (OMEGA-3-6-9 PO) Take 1 capsule by mouth in the morning and at bedtime.     [provider]  Propylene Glycol (SYSTANE BALANCE OP) Place 1 drop into both eyes in the morning and at bedtime. Patient not taking: Reported on 07/07/2023    [provider]  ranolazine  (RANEXA ) 500 MG 12  hr tablet Take 1 tablet (500 mg total) by mouth 2 (two) times daily. 02/11/17   Pasqual Bone, MD  rosuvastatin  (CRESTOR ) 40 MG tablet Take 40 mg by mouth daily. 06/09/22   [provider]  tiZANidine (ZANAFLEX) 4 MG tablet Take 2 mg by mouth 3 (three) times daily as needed.    [provider]  vitamin E 400 UNIT capsule Take 400 Units by mouth daily.    [provider]  zolpidem  (AMBIEN ) 5 MG tablet Take 5 mg by mouth at bedtime as needed for sleep. 05/24/22   [provider]    Family History Family History  Problem Relation Age of Onset   Heart attack Mother 55   Heart attack Father 28   Heart disease Brother    Breast cancer Neg Hx     Social History Social History   Tobacco Use   Smoking status: Never   Smokeless tobacco: Never  Vaping Use   Vaping status: Never Used  Substance Use Topics   Alcohol  use: No   Drug use: No     Allergies   Tape and Nitroglycerin    Review of Systems Review of Systems  Eyes:        Eye irritation  All other systems reviewed and are negative.    Physical Exam Triage Vital Signs ED Triage Vitals  Encounter Vitals Group     BP 03/16/24 1111 128/72     Girls Systolic BP Percentile --      Girls Diastolic BP Percentile --      Boys Systolic BP Percentile --      Boys Diastolic BP Percentile --      Pulse Rate 03/16/24 1111 76     Resp 03/16/24 1111 18     Temp 03/16/24 1111 98.6 F (37 C)     Temp Source 03/16/24 1111 Oral     SpO2 03/16/24 1111 98 %     Weight 03/16/24 1105 173 lb 15.1 oz (78.9 kg)     Height 03/16/24 1105 5' 2 (1.575 m)     Head Circumference --      Peak Flow --      Pain Score 03/16/24 1104 0     Pain Loc --      Pain Education --      Exclude from Growth Chart --    No  data found.  Updated Vital Signs BP 128/72 (BP Location: Left Arm)   Pulse 76   Temp 98.6 F (37 C) (Oral)   Resp 18   Ht 5' 2 (1.575 m)   Wt 173 lb 15.1 oz (78.9 kg)   LMP 05/29/2011   SpO2  98%   BMI 31.81 kg/m   Visual Acuity Right Eye Distance: 20/30 (Corrected) Left Eye Distance: 20/25 (Corrected) Bilateral Distance: 20/25 (Corrected)  Right Eye Near:   Left Eye Near:    Bilateral Near:     Physical Exam Vitals and nursing note reviewed.  Constitutional:      General: She is not in acute distress.    Appearance: She is well-developed.  HENT:     Head: Normocephalic and atraumatic.   Eyes:     General:        Right eye: No discharge.        Left eye: No discharge.     Extraocular Movements: Extraocular movements intact.     Conjunctiva/sclera: Conjunctivae normal.     Pupils: Pupils are equal, round, and reactive to light.      Comments: No appreciable conjunctival injection or significant drainage or discharge present.  Skin towards the left eyebrow with increased redness and discomfort likely from contact irritant. No orbital swelling or protosis present.   Cardiovascular:     Rate and Rhythm: Normal rate and regular rhythm.     Heart sounds: No murmur heard. Pulmonary:     Effort: Pulmonary effort is normal. No respiratory distress.     Breath sounds: Normal breath sounds.  Abdominal:     Palpations: Abdomen is soft.     Tenderness: There is no abdominal tenderness.   Musculoskeletal:        General: No swelling.     Cervical back: Neck supple.   Skin:    General: Skin is warm and dry.     Capillary Refill: Capillary refill takes less than 2 seconds.   Neurological:     Mental Status: She is alert.   Psychiatric:        Mood and Affect: Mood normal.      UC Treatments / Results  Labs (all labs ordered are listed, but only abnormal results are displayed) Labs Reviewed - No data to display  EKG   Radiology No results found.  Procedures Procedures (including critical care time)  Medications Ordered in UC Medications  tetracaine (PONTOCAINE) 0.5 % ophthalmic solution 2 drop (has no administration in time range)  fluorescein  ophthalmic strip 1 strip (has no administration in time range)    Initial Impression / Assessment and Plan / UC Course  I have reviewed the triage vital signs and the nursing notes.  Pertinent labs & imaging results that were available during my care of the patient were reviewed by me and considered in my medical decision making (see chart for details).   This patient presents to the ED for concern of eye irritation.  Differential diagnosis includes conjunctivitis, contact dermatitis, stye, corneal abrasion   Medicines ordered and prescription drug management:  I ordered medication including tetracaine and fluorescein stain for ophthalmic evaluation Reevaluation of the patient after these medicines showed that the patient improved I have reviewed the patients home medicines and have made adjustments as needed   Problem List / ED Course:  Patient presents to the urgent care today with concerns of eye irritation.  Reports that the left eye has become increasingly uncomfortable over the last  2 days with feelings of itching to the upper eyelid and feelings of something in the left eye but denies any foreign objects falling into the eye recently.  Denies any changes with regard to cosmetics or any products that she is using.  No other symptoms reported. On exam, patient does appear to have some erythematous and irritated skin to the left upper eyelid.  Left eye is not injected there is no obvious foreign body present.  Will perform fluorescein staining under tetracaine anesthetic for evaluation of possible corneal abrasion. Fluorescein staining shows corneal abrasion to the 5 o'clock position.  Given finding, will discharge home with a course of ophthalmic antibiotic drops.  Also advised use of a low-dose topical steroid for the irritation to the upper and outer eyebrow where some of the irritation is present.  Encourage close follow-up with PCP for repeat evaluation in the urgent care for  evaluation of the corneal abrasion.  Otherwise stable at this time for discharge home and outpatient follow-up.  Final Clinical Impressions(s) / UC Diagnoses   Final diagnoses:  Abrasion of left cornea, initial encounter     Discharge Instructions      You are seen today for concerns of irritation to the left eye.  You are found to have a corneal abrasion with small scratch to the left cornea ongoing.  I am sending home with a prescription for an antibiotic ointment called erythromycin that you will apply 4 times daily to the left eye.  You may also apply topical steroid such as hydrocortisone that you can buy over-the-counter to the irritated skin of the left eye however do not apply directly to the eyelid.  For any concerns of new or worsening symptoms, follow-up with us  at the urgent care or seek care in the emergency department.     ED Prescriptions     Medication Sig Dispense Auth. Provider   erythromycin ophthalmic ointment  (Status: Discontinued) Place a 1/2 inch ribbon of ointment into the lower eyelid. 3.5 g Alexza Norbeck A, PA-C   erythromycin ophthalmic ointment Place into the left eye 4 (four) times daily for 5 days. 3.5 g Stevi Hollinshead A, PA-C      PDMP not reviewed this encounter.   Emanual Lamountain A, PA-C 03/16/24 1258

## 2024-03-20 ENCOUNTER — Ambulatory Visit (INDEPENDENT_AMBULATORY_CARE_PROVIDER_SITE_OTHER): Payer: 59 | Admitting: Ophthalmology

## 2024-03-20 ENCOUNTER — Encounter (INDEPENDENT_AMBULATORY_CARE_PROVIDER_SITE_OTHER): Payer: Self-pay | Admitting: Ophthalmology

## 2024-03-20 DIAGNOSIS — I10 Essential (primary) hypertension: Secondary | ICD-10-CM | POA: Diagnosis not present

## 2024-03-20 DIAGNOSIS — E119 Type 2 diabetes mellitus without complications: Secondary | ICD-10-CM

## 2024-03-20 DIAGNOSIS — Z961 Presence of intraocular lens: Secondary | ICD-10-CM

## 2024-03-20 DIAGNOSIS — Z7984 Long term (current) use of oral hypoglycemic drugs: Secondary | ICD-10-CM

## 2024-03-20 DIAGNOSIS — Z794 Long term (current) use of insulin: Secondary | ICD-10-CM | POA: Diagnosis not present

## 2024-03-20 DIAGNOSIS — H35033 Hypertensive retinopathy, bilateral: Secondary | ICD-10-CM

## 2024-03-20 DIAGNOSIS — H3093 Unspecified chorioretinal inflammation, bilateral: Secondary | ICD-10-CM

## 2024-03-24 ENCOUNTER — Encounter (INDEPENDENT_AMBULATORY_CARE_PROVIDER_SITE_OTHER): Payer: Self-pay | Admitting: Ophthalmology

## 2024-04-09 ENCOUNTER — Inpatient Hospital Stay: Admission: RE | Admit: 2024-04-09 | Source: Ambulatory Visit

## 2024-12-11 ENCOUNTER — Ambulatory Visit: Admitting: Family Medicine

## 2025-03-20 ENCOUNTER — Encounter (INDEPENDENT_AMBULATORY_CARE_PROVIDER_SITE_OTHER): Admitting: Ophthalmology
